# Patient Record
Sex: Male | Born: 1960 | Race: White | Hispanic: No | Marital: Married | Smoking: Current every day smoker
Health system: Southern US, Community
[De-identification: ages and names within clinical notes are randomized; demographics above are authoritative.]

## PROBLEM LIST (undated history)

## (undated) DIAGNOSIS — F1020 Alcohol dependence, uncomplicated: Secondary | ICD-10-CM

## (undated) DIAGNOSIS — J449 Chronic obstructive pulmonary disease, unspecified: Secondary | ICD-10-CM

## (undated) DIAGNOSIS — G8929 Other chronic pain: Secondary | ICD-10-CM

## (undated) DIAGNOSIS — K635 Polyp of colon: Secondary | ICD-10-CM

## (undated) DIAGNOSIS — M545 Low back pain: Secondary | ICD-10-CM

## (undated) DIAGNOSIS — B019 Varicella without complication: Secondary | ICD-10-CM

## (undated) HISTORY — DX: Alcohol dependence, uncomplicated: F10.20

## (undated) HISTORY — DX: Varicella without complication: B01.9

## (undated) HISTORY — PX: SHOULDER SURGERY: SHX246

## (undated) HISTORY — DX: Low back pain: M54.5

## (undated) HISTORY — DX: Other chronic pain: G89.29

## (undated) HISTORY — PX: SPINAL CORD STIMULATOR INSERTION: SHX5378

## (undated) HISTORY — PX: ROTATOR CUFF REPAIR: SHX139

## (undated) HISTORY — DX: Polyp of colon: K63.5

---
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
## 1999-02-11 HISTORY — PX: OTHER SURGICAL HISTORY: SHX169

## 2006-12-09 ENCOUNTER — Emergency Department (HOSPITAL_COMMUNITY): Admission: EM | Admit: 2006-12-09 | Discharge: 2006-12-09 | Payer: Self-pay | Admitting: Emergency Medicine

## 2007-07-03 ENCOUNTER — Emergency Department (HOSPITAL_COMMUNITY): Admission: EM | Admit: 2007-07-03 | Discharge: 2007-07-03 | Payer: Self-pay | Admitting: Emergency Medicine

## 2008-10-22 ENCOUNTER — Emergency Department (HOSPITAL_COMMUNITY): Admission: EM | Admit: 2008-10-22 | Discharge: 2008-10-22 | Payer: Self-pay | Admitting: Emergency Medicine

## 2008-12-08 ENCOUNTER — Encounter: Admission: RE | Admit: 2008-12-08 | Discharge: 2008-12-08 | Payer: Self-pay | Admitting: Orthopedic Surgery

## 2009-03-08 ENCOUNTER — Emergency Department (HOSPITAL_COMMUNITY): Admission: EM | Admit: 2009-03-08 | Discharge: 2009-03-08 | Payer: Self-pay | Admitting: Emergency Medicine

## 2009-10-13 ENCOUNTER — Emergency Department (HOSPITAL_COMMUNITY): Admission: EM | Admit: 2009-10-13 | Discharge: 2009-10-14 | Payer: Self-pay | Admitting: Emergency Medicine

## 2010-05-17 LAB — URINALYSIS, ROUTINE W REFLEX MICROSCOPIC
Bilirubin Urine: NEGATIVE
Glucose, UA: NEGATIVE mg/dL
Hgb urine dipstick: NEGATIVE
Ketones, ur: NEGATIVE mg/dL
Nitrite: NEGATIVE
Protein, ur: NEGATIVE mg/dL
Specific Gravity, Urine: 1.009 (ref 1.005–1.030)
Urobilinogen, UA: 0.2 mg/dL (ref 0.0–1.0)
pH: 6 (ref 5.0–8.0)

## 2010-05-17 LAB — COMPREHENSIVE METABOLIC PANEL
ALT: 26 U/L (ref 0–53)
AST: 23 U/L (ref 0–37)
Albumin: 3.8 g/dL (ref 3.5–5.2)
Alkaline Phosphatase: 71 U/L (ref 39–117)
BUN: 7 mg/dL (ref 6–23)
CO2: 26 mEq/L (ref 19–32)
Calcium: 8.9 mg/dL (ref 8.4–10.5)
Chloride: 108 mEq/L (ref 96–112)
Creatinine, Ser: 0.9 mg/dL (ref 0.4–1.5)
GFR calc Af Amer: >60 mL/min (ref >60)
GFR calc non Af Amer: >60 mL/min (ref >60)
Glucose, Bld: 97 mg/dL (ref 70–99)
Potassium: 4 mEq/L (ref 3.5–5.1)
Sodium: 141 mEq/L (ref 135–145)
Total Bilirubin: 0.6 mg/dL (ref 0.3–1.2)
Total Protein: 6.2 g/dL (ref 6.0–8.3)

## 2010-05-17 LAB — DIFFERENTIAL
Basophils Absolute: 0.1 10*3/uL (ref 0.0–0.1)
Basophils Relative: 1 % (ref 0–1)
Eosinophils Absolute: 0.1 10*3/uL (ref 0.0–0.7)
Eosinophils Relative: 1 % (ref 0–5)
Lymphocytes Relative: 17 % (ref 12–46)
Lymphs Abs: 1.5 10*3/uL (ref 0.7–4.0)
Monocytes Absolute: 0.5 10*3/uL (ref 0.1–1.0)
Monocytes Relative: 5 % (ref 3–12)
Neutro Abs: 6.9 10*3/uL (ref 1.7–7.7)
Neutrophils Relative %: 76 % (ref 43–77)

## 2010-05-17 LAB — CBC
HCT: 44.4 % (ref 39.0–52.0)
Hemoglobin: 15.2 g/dL (ref 13.0–17.0)
MCHC: 34.2 g/dL (ref 30.0–36.0)
MCV: 92 fL (ref 78.0–100.0)
Platelets: 230 10*3/uL (ref 150–400)
RBC: 4.83 MIL/uL (ref 4.22–5.81)
RDW: 14 % (ref 11.5–15.5)
WBC: 9.1 10*3/uL (ref 4.0–10.5)

## 2010-05-17 LAB — LIPASE, BLOOD: Lipase: 25 U/L (ref 11–59)

## 2010-05-17 LAB — ETHANOL: Alcohol, Ethyl (B): 110 mg/dL — ABNORMAL HIGH (ref 0–10)

## 2010-11-20 LAB — DIFFERENTIAL
Basophils Absolute: 0
Basophils Relative: 0
Eosinophils Absolute: 0.1
Eosinophils Relative: 1
Lymphocytes Relative: 26
Lymphs Abs: 2
Monocytes Absolute: 0.6
Monocytes Relative: 8
Neutro Abs: 4.9
Neutrophils Relative %: 65

## 2010-11-20 LAB — POCT CARDIAC MARKERS
CKMB, poc: 6.2
CKMB, poc: 6.5
Myoglobin, poc: 106
Myoglobin, poc: 95.7
Operator id: 234501
Operator id: 235261
Troponin i, poc: <0.05
Troponin i, poc: <0.05

## 2010-11-20 LAB — I-STAT 8, (EC8 V) (CONVERTED LAB)
Acid-Base Excess: 1
BUN: 15
Bicarbonate: 25.4 — ABNORMAL HIGH
Chloride: 106
Glucose, Bld: 93
HCT: 46
Hemoglobin: 15.6
Operator id: 235261
Potassium: 4
Sodium: 139
TCO2: 27
pCO2, Ven: 39.1 — ABNORMAL LOW
pH, Ven: 7.42 — ABNORMAL HIGH

## 2010-11-20 LAB — POCT I-STAT CREATININE
Creatinine, Ser: 0.9
Operator id: 235261

## 2010-11-20 LAB — CBC
HCT: 43.6
Hemoglobin: 14.9
MCHC: 34
MCV: 89.1
Platelets: 269
RBC: 4.9
RDW: 13.6
WBC: 7.5

## 2010-11-20 LAB — D-DIMER, QUANTITATIVE: D-Dimer, Quant: 0.3

## 2012-09-05 ENCOUNTER — Encounter (HOSPITAL_COMMUNITY): Payer: Self-pay | Admitting: *Deleted

## 2012-09-05 ENCOUNTER — Emergency Department (HOSPITAL_COMMUNITY): Payer: 59

## 2012-09-05 ENCOUNTER — Emergency Department (HOSPITAL_COMMUNITY)
Admission: EM | Admit: 2012-09-05 | Discharge: 2012-09-05 | Disposition: A | Discharge status: Home / Self Care | Payer: 59 | Attending: Emergency Medicine | Admitting: Emergency Medicine

## 2012-09-05 DIAGNOSIS — J4489 Other specified chronic obstructive pulmonary disease: Secondary | ICD-10-CM | POA: Insufficient documentation

## 2012-09-05 DIAGNOSIS — R109 Unspecified abdominal pain: Secondary | ICD-10-CM

## 2012-09-05 DIAGNOSIS — J449 Chronic obstructive pulmonary disease, unspecified: Secondary | ICD-10-CM | POA: Insufficient documentation

## 2012-09-05 DIAGNOSIS — R1032 Left lower quadrant pain: Secondary | ICD-10-CM | POA: Insufficient documentation

## 2012-09-05 HISTORY — DX: Chronic obstructive pulmonary disease, unspecified: J44.9

## 2012-09-05 LAB — CBC WITH DIFFERENTIAL/PLATELET
Basophils Absolute: 0 10*3/uL (ref 0.0–0.1)
Basophils Relative: 0 % (ref 0–1)
Eosinophils Absolute: 0.1 10*3/uL (ref 0.0–0.7)
Eosinophils Relative: 2 % (ref 0–5)
HCT: 43.2 % (ref 39.0–52.0)
Hemoglobin: 14.8 g/dL (ref 13.0–17.0)
Lymphocytes Relative: 29 % (ref 12–46)
Lymphs Abs: 2.2 10*3/uL (ref 0.7–4.0)
MCH: 30.5 pg (ref 26.0–34.0)
MCHC: 34.3 g/dL (ref 30.0–36.0)
MCV: 88.9 fL (ref 78.0–100.0)
Monocytes Absolute: 0.5 10*3/uL (ref 0.1–1.0)
Monocytes Relative: 7 % (ref 3–12)
Neutro Abs: 4.8 10*3/uL (ref 1.7–7.7)
Neutrophils Relative %: 63 % (ref 43–77)
Platelets: 259 10*3/uL (ref 150–400)
RBC: 4.86 MIL/uL (ref 4.22–5.81)
RDW: 13.4 % (ref 11.5–15.5)
WBC: 7.7 10*3/uL (ref 4.0–10.5)

## 2012-09-05 LAB — BASIC METABOLIC PANEL
BUN: 12 mg/dL (ref 6–23)
CO2: 27 mEq/L (ref 19–32)
Calcium: 9 mg/dL (ref 8.4–10.5)
Chloride: 106 mEq/L (ref 96–112)
Creatinine, Ser: 0.91 mg/dL (ref 0.50–1.35)
GFR calc Af Amer: >90 mL/min (ref >90)
GFR calc non Af Amer: >90 mL/min (ref >90)
Glucose, Bld: 105 mg/dL — ABNORMAL HIGH (ref 70–99)
Potassium: 3.8 mEq/L (ref 3.5–5.1)
Sodium: 140 mEq/L (ref 135–145)

## 2012-09-05 LAB — URINALYSIS, ROUTINE W REFLEX MICROSCOPIC
Bilirubin Urine: NEGATIVE
Glucose, UA: NEGATIVE mg/dL
Hgb urine dipstick: NEGATIVE
Ketones, ur: NEGATIVE mg/dL
Leukocytes, UA: NEGATIVE
Nitrite: NEGATIVE
Protein, ur: NEGATIVE mg/dL
Specific Gravity, Urine: >1.03 — ABNORMAL HIGH (ref 1.005–1.030)
Urobilinogen, UA: 1 mg/dL (ref 0.0–1.0)
pH: 6.5 (ref 5.0–8.0)

## 2012-09-05 MED ORDER — KETOROLAC TROMETHAMINE 30 MG/ML IJ SOLN
30.0000 mg | Freq: Once | INTRAMUSCULAR | Status: AC
  Administered 2012-09-05: 30 mg via INTRAVENOUS
  Filled 2012-09-05: qty 1

## 2012-09-05 MED ORDER — SODIUM CHLORIDE 0.9 % IV BOLUS (SEPSIS)
500.0000 mL | Freq: Once | INTRAVENOUS | Status: AC
  Administered 2012-09-05: 500 mL via INTRAVENOUS

## 2012-09-05 MED ORDER — TRAMADOL HCL 50 MG PO TABS: 50.0000 mg | ORAL_TABLET | Freq: Four times a day (QID) | ORAL | Status: DC | PRN

## 2012-09-05 NOTE — ED Notes (Signed)
MD at bedside. 

## 2012-09-05 NOTE — ED Notes (Signed)
Pt states he has a knot in the left side of his abdomen with pain that radiates down to his groin.

## 2012-09-05 NOTE — ED Provider Notes (Signed)
CSN: [id number]     Arrival date & time 09/05/12  2038 History     First MD Initiated Contact with Patient 09/05/12 2058     Chief Complaint  Patient presents with  . Abdominal Pain   (Consider location/radiation/quality/duration/timing/severity/associated sxs/prior Treatment) HPI Comments: 52 yo male with past etoh, non smoker presents with LLQ abd pain since Friday.  Mild radiation to left groin.  Intermittent sharp/ ache, at times worse with movement.  No abd surgery hx.  Pt has known small umbilical hernia.  No fevers, vomiting or diarrhea.  No kidney stone hx.  No testicle pain.    Patient is a 52 y.o. male presenting with abdominal pain. The history is provided by the patient.  Abdominal Pain Associated symptoms include abdominal pain. Pertinent negatives include no chest pain, no headaches and no shortness of breath.    Past Medical History  Diagnosis Date  . COPD (chronic obstructive pulmonary disease)    Past Surgical History  Procedure Laterality Date  . Shoulder surgery     History reviewed. No pertinent family history. History  Substance Use Topics  . Smoking status: Never Smoker   . Smokeless tobacco: Not on file  . Alcohol Use: No    Review of Systems  Constitutional: Negative for fever and chills.  HENT: Negative for neck pain and neck stiffness.   Eyes: Negative for visual disturbance.  Respiratory: Negative for shortness of breath.   Cardiovascular: Negative for chest pain.  Gastrointestinal: Positive for abdominal pain. Negative for nausea, vomiting, diarrhea and blood in stool.  Genitourinary: Positive for flank pain. Negative for dysuria.  Musculoskeletal: Negative for back pain.  Skin: Negative for rash.  Neurological: Negative for light-headedness and headaches.    Allergies  Bee venom  Home Medications  No current outpatient prescriptions on file. BP 131/87  Pulse 70  Temp(Src) 98.2 F (36.8 C)  Resp 20  Ht 5\' 7"  (1.702 m)  Wt 220 lb  (99.791 kg)  BMI 34.45 kg/m2  SpO2 98% Physical Exam  Nursing note and vitals reviewed. Constitutional: He is oriented to person, place, and time. He appears well-developed and well-nourished.  HENT:  Head: Normocephalic and atraumatic.  Eyes: Conjunctivae are normal. Right eye exhibits no discharge. Left eye exhibits no discharge.  Neck: Normal range of motion. Neck supple. No tracheal deviation present.  Cardiovascular: Normal rate and regular rhythm.   Pulmonary/Chest: Effort normal and breath sounds normal.  Abdominal: Soft. He exhibits no distension. There is tenderness (left lower quadrant, no hernia palpated in left groin). There is no guarding.  Musculoskeletal: He exhibits no edema.  Neurological: He is alert and oriented to person, place, and time.  Skin: Skin is warm. No rash noted.  Psychiatric: He has a normal mood and affect.    ED Course   Procedures (including critical care time)  Labs Reviewed  BASIC METABOLIC PANEL - Abnormal; Notable for the following:    Glucose, Bld 105 (*)    All other components within normal limits  CBC WITH DIFFERENTIAL  URINALYSIS, ROUTINE W REFLEX MICROSCOPIC   No results found. No diagnosis found.  MDM  Well appearing.  Concern for kidney stone vs diverticular vs MSK.  Pain meds given . Fluids and CT scan.   Ct Abdomen Pelvis Wo Contrast  09/05/2012   *RADIOLOGY REPORT*  Clinical Data: Left lower abdominal pain radiating into groin.  CT ABDOMEN AND PELVIS WITHOUT CONTRAST  Technique:  Multidetector CT imaging of the abdomen and pelvis was  performed following the standard protocol without intravenous contrast.  Comparison: None.  Findings: No evidence of renal obstruction or calculi.  The ureters and bladder are unremarkable.  Unenhanced appearance of the liver, gallbladder, pancreas, spleen, adrenal glands and bowel are unremarkable.  No evidence of bowel obstruction or inflammatory process.  No free fluid or abnormal fluid collection  is identified.  Small nonspecific mesenteric lymph nodes identified.  No overtly enlarged lymph nodes seen.  The appendix is visualized and normal.  Degenerative is present in the lower lumbar spine.  IMPRESSION: No acute findings.   Original Report Authenticated By: Irish Lack, M.D.   Close fup outpt.  No cause for abd pain at this time.  Pain improved in ED.   Enid Skeens, MD 09/05/12 2221

## 2013-01-26 ENCOUNTER — Encounter: Payer: Self-pay | Admitting: Family Medicine

## 2013-01-26 ENCOUNTER — Ambulatory Visit (INDEPENDENT_AMBULATORY_CARE_PROVIDER_SITE_OTHER): Payer: PRIVATE HEALTH INSURANCE | Admitting: Family Medicine

## 2013-01-26 ENCOUNTER — Encounter (INDEPENDENT_AMBULATORY_CARE_PROVIDER_SITE_OTHER): Payer: Self-pay

## 2013-01-26 VITALS — BP 121/79 | HR 65 | Temp 97.6°F | Ht 67.0 in | Wt 205.0 lb

## 2013-01-26 DIAGNOSIS — Z0000 Encounter for general adult medical examination without abnormal findings: Secondary | ICD-10-CM

## 2013-01-26 LAB — POCT CBC
Granulocyte percent: 75.5 %G (ref 37–80)
HCT, POC: 46.8 % (ref 43.5–53.7)
Hemoglobin: 15.9 g/dL (ref 14.1–18.1)
Lymph, poc: 1.6 (ref 0.6–3.4)
MCH, POC: 30.8 pg (ref 27–31.2)
MCHC: 33.9 g/dL (ref 31.8–35.4)
MCV: 90.9 fL (ref 80–97)
MPV: 7.6 fL (ref 0–99.8)
POC Granulocyte: 6 (ref 2–6.9)
POC LYMPH PERCENT: 20.4 %L (ref 10–50)
Platelet Count, POC: 252 10*3/uL (ref 142–424)
RBC: 5.2 M/uL (ref 4.69–6.13)
RDW, POC: 13.5 %
WBC: 7.9 10*3/uL (ref 4.6–10.2)

## 2013-01-26 NOTE — Progress Notes (Signed)
   Subjective:    Patient ID: Matthew Sims, male    DOB: Aug 01, 1960, 52 y.o.   MRN: [id number]  HPI  This 52 y.o. male presents for evaluation of CPE.  He has no acute medical problems.  Review of Systems No chest pain, SOB, HA, dizziness, vision change, N/V, diarrhea, constipation, dysuria, urinary urgency or frequency, myalgias, arthralgias or rash.     Objective:   Physical Exam  Vital signs noted  Well developed well nourished male.  HEENT - Head atraumatic Normocephalic                Eyes - PERRLA, Conjuctiva - clear Sclera- Clear EOMI                Ears - EAC's Wnl TM's Wnl Gross Hearing WNL                Nose - Nares patent                 Throat - oropharanx wnl with poor dentition. Respiratory - Lungs CTA bilateral Cardiac - RRR S1 and S2 without murmur GI - Abdomen soft Nontender and bowel sounds active x 4 Extremities - No edema. Neuro - Grossly intact.      Assessment & Plan:  Routine general medical examination at a health care facility - Plan: POCT CBC, CMP14+EGFR, Lipid panel, Thyroid Panel With TSH, PSA, total and free Declines colonoscopy, flu shot, and recommend he see dentist.  Deatra Canter FNP

## 2013-01-27 LAB — CMP14+EGFR
ALT: 21 IU/L (ref 0–44)
AST: 22 IU/L (ref 0–40)
Albumin/Globulin Ratio: 1.8 (ref 1.1–2.5)
Albumin: 4.1 g/dL (ref 3.5–5.5)
Alkaline Phosphatase: 72 IU/L (ref 39–117)
BUN/Creatinine Ratio: 15 (ref 9–20)
BUN: 13 mg/dL (ref 6–24)
CO2: 24 mmol/L (ref 18–29)
Calcium: 9.6 mg/dL (ref 8.7–10.2)
Chloride: 103 mmol/L (ref 97–108)
Creatinine, Ser: 0.85 mg/dL (ref 0.76–1.27)
GFR calc Af Amer: 114 mL/min/{1.73_m2} (ref >59)
GFR calc non Af Amer: 99 mL/min/{1.73_m2} (ref >59)
Globulin, Total: 2.3 g/dL (ref 1.5–4.5)
Glucose: 88 mg/dL (ref 65–99)
Potassium: 4.2 mmol/L (ref 3.5–5.2)
Sodium: 141 mmol/L (ref 134–144)
Total Bilirubin: 0.6 mg/dL (ref 0.0–1.2)
Total Protein: 6.4 g/dL (ref 6.0–8.5)

## 2013-01-27 LAB — PSA, TOTAL AND FREE
PSA, Free Pct: 20 %
PSA, Free: 0.2 ng/mL
PSA: 1 ng/mL (ref 0.0–4.0)

## 2013-01-27 LAB — THYROID PANEL WITH TSH
Free Thyroxine Index: 2 (ref 1.2–4.9)
T3 Uptake Ratio: 26 % (ref 24–39)
T4, Total: 7.6 ug/dL (ref 4.5–12.0)
TSH: 0.793 u[IU]/mL (ref 0.450–4.500)

## 2013-01-27 LAB — LIPID PANEL
Chol/HDL Ratio: 3.1 ratio units (ref 0.0–5.0)
Cholesterol, Total: 151 mg/dL (ref 100–199)
HDL: 48 mg/dL (ref >39)
LDL Calculated: 79 mg/dL (ref 0–99)
Triglycerides: 121 mg/dL (ref 0–149)
VLDL Cholesterol Cal: 24 mg/dL (ref 5–40)

## 2013-02-01 ENCOUNTER — Telehealth: Payer: Self-pay | Admitting: *Deleted

## 2013-02-01 NOTE — Telephone Encounter (Signed)
Patient aware of results.

## 2013-02-01 NOTE — Telephone Encounter (Signed)
Message copied by Tamera Punt on Tue Feb 01, 2013 11:29 AM ------      Message from: Bennie Pierini      Created: Thu Jan 27, 2013  5:33 PM       All labs are normal- continue all meds and recheck in 3 months ------

## 2014-02-10 HISTORY — PX: HERNIA REPAIR: SHX51

## 2014-07-10 ENCOUNTER — Encounter (HOSPITAL_COMMUNITY): Payer: Self-pay | Admitting: Emergency Medicine

## 2014-07-10 ENCOUNTER — Emergency Department (HOSPITAL_COMMUNITY): Payer: BLUE CROSS/BLUE SHIELD

## 2014-07-10 ENCOUNTER — Emergency Department (HOSPITAL_COMMUNITY)
Admission: EM | Admit: 2014-07-10 | Discharge: 2014-07-10 | Disposition: A | Discharge status: Home / Self Care | Payer: BLUE CROSS/BLUE SHIELD | Attending: Emergency Medicine | Admitting: Emergency Medicine

## 2014-07-10 DIAGNOSIS — Y9389 Activity, other specified: Secondary | ICD-10-CM | POA: Diagnosis not present

## 2014-07-10 DIAGNOSIS — X58XXXA Exposure to other specified factors, initial encounter: Secondary | ICD-10-CM | POA: Insufficient documentation

## 2014-07-10 DIAGNOSIS — Z720 Tobacco use: Secondary | ICD-10-CM | POA: Insufficient documentation

## 2014-07-10 DIAGNOSIS — T7840XA Allergy, unspecified, initial encounter: Secondary | ICD-10-CM | POA: Insufficient documentation

## 2014-07-10 DIAGNOSIS — Y998 Other external cause status: Secondary | ICD-10-CM | POA: Insufficient documentation

## 2014-07-10 DIAGNOSIS — J449 Chronic obstructive pulmonary disease, unspecified: Secondary | ICD-10-CM | POA: Diagnosis not present

## 2014-07-10 DIAGNOSIS — Y9289 Other specified places as the place of occurrence of the external cause: Secondary | ICD-10-CM | POA: Diagnosis not present

## 2014-07-10 DIAGNOSIS — R079 Chest pain, unspecified: Secondary | ICD-10-CM | POA: Insufficient documentation

## 2014-07-10 LAB — BASIC METABOLIC PANEL
Anion gap: 5 (ref 5–15)
BUN: 11 mg/dL (ref 6–20)
CO2: 22 mmol/L (ref 22–32)
Calcium: 9.1 mg/dL (ref 8.9–10.3)
Chloride: 111 mmol/L (ref 101–111)
Creatinine, Ser: 0.94 mg/dL (ref 0.61–1.24)
GFR calc Af Amer: >60 mL/min (ref >60)
GFR calc non Af Amer: >60 mL/min (ref >60)
Glucose, Bld: 100 mg/dL — ABNORMAL HIGH (ref 65–99)
Potassium: 4 mmol/L (ref 3.5–5.1)
Sodium: 138 mmol/L (ref 135–145)

## 2014-07-10 LAB — CBC WITH DIFFERENTIAL/PLATELET
Basophils Absolute: 0 10*3/uL (ref 0.0–0.1)
Basophils Relative: 0 % (ref 0–1)
Eosinophils Absolute: 0 10*3/uL (ref 0.0–0.7)
Eosinophils Relative: 0 % (ref 0–5)
HCT: 45.6 % (ref 39.0–52.0)
Hemoglobin: 15.6 g/dL (ref 13.0–17.0)
Lymphocytes Relative: 7 % — ABNORMAL LOW (ref 12–46)
Lymphs Abs: 1.1 10*3/uL (ref 0.7–4.0)
MCH: 30.4 pg (ref 26.0–34.0)
MCHC: 34.2 g/dL (ref 30.0–36.0)
MCV: 88.7 fL (ref 78.0–100.0)
Monocytes Absolute: 0.5 10*3/uL (ref 0.1–1.0)
Monocytes Relative: 3 % (ref 3–12)
Neutro Abs: 13.4 10*3/uL — ABNORMAL HIGH (ref 1.7–7.7)
Neutrophils Relative %: 90 % — ABNORMAL HIGH (ref 43–77)
Platelets: 253 10*3/uL (ref 150–400)
RBC: 5.14 MIL/uL (ref 4.22–5.81)
RDW: 13.6 % (ref 11.5–15.5)
WBC: 15 10*3/uL — ABNORMAL HIGH (ref 4.0–10.5)

## 2014-07-10 LAB — TROPONIN I
Troponin I: <0.03 ng/mL (ref <0.031)
Troponin I: <0.03 ng/mL (ref <0.031)

## 2014-07-10 MED ORDER — PREDNISONE 20 MG PO TABS: 40.0000 mg | ORAL_TABLET | Freq: Every day | ORAL | Status: DC

## 2014-07-10 MED ORDER — SODIUM CHLORIDE 0.9 % IV SOLN
INTRAVENOUS | Status: DC
Start: 2014-07-10 — End: 2014-07-11
  Administered 2014-07-10: 17:00:00 via INTRAVENOUS

## 2014-07-10 NOTE — ED Notes (Signed)
Pt was stung by a unknown insect pt believes it was a bee. Pt has redness and swelling to left elbow and both lower legs on calfs. Pt had episodes of on and off chest pain while at [facility] urgent care. 325mg  ASA given, 50Mg  benadryl and 125mg  soul medrol. Pt denies chest pain or SOB at this time.

## 2014-07-10 NOTE — Discharge Instructions (Signed)
Allergies °Allergies may happen from anything your body is sensitive to. This may be food, medicines, pollens, chemicals, and nearly anything around you in everyday life that produces allergens. An allergen is anything that causes an allergy producing substance. Heredity is often a factor in causing these problems. This means you may have some of the same allergies as your parents. °Food allergies happen in all age groups. Food allergies are some of the most severe and life threatening. Some common food allergies are cow's milk, seafood, eggs, nuts, wheat, and soybeans. °SYMPTOMS  °· Swelling around the mouth. °· An itchy red rash or hives. °· Vomiting or diarrhea. °· Difficulty breathing. °SEVERE ALLERGIC REACTIONS ARE LIFE-THREATENING. °This reaction is called anaphylaxis. It can cause the mouth and throat to swell and cause difficulty with breathing and swallowing. In severe reactions only a trace amount of food (for example, peanut oil in a salad) may cause death within seconds. °Seasonal allergies occur in all age groups. These are seasonal because they usually occur during the same season every year. They may be a reaction to molds, grass pollens, or tree pollens. Other causes of problems are house dust mite allergens, pet dander, and mold spores. The symptoms often consist of nasal congestion, a runny itchy nose associated with sneezing, and tearing itchy eyes. There is often an associated itching of the mouth and ears. The problems happen when you come in contact with pollens and other allergens. Allergens are the particles in the air that the body reacts to with an allergic reaction. This causes you to release allergic antibodies. Through a chain of events, these eventually cause you to release histamine into the blood stream. Although it is meant to be protective to the body, it is this release that causes your discomfort. This is why you were given anti-histamines to feel better.  If you are unable to  pinpoint the offending allergen, it may be determined by skin or blood testing. Allergies cannot be cured but can be controlled with medicine. °Hay fever is a collection of all or some of the seasonal allergy problems. It may often be treated with simple over-the-counter medicine such as diphenhydramine. Take medicine as directed. Do not drink alcohol or drive while taking this medicine. Check with your caregiver or package insert for child dosages. °If these medicines are not effective, there are many new medicines your caregiver can prescribe. Stronger medicine such as nasal spray, eye drops, and corticosteroids may be used if the first things you try do not work well. Other treatments such as immunotherapy or desensitizing injections can be used if all else fails. Follow up with your caregiver if problems continue. These seasonal allergies are usually not life threatening. They are generally more of a nuisance that can often be handled using medicine. °HOME CARE INSTRUCTIONS  °· If unsure what causes a reaction, keep a diary of foods eaten and symptoms that follow. Avoid foods that cause reactions. °· If hives or rash are present: °· Take medicine as directed. °· You may use an over-the-counter antihistamine (diphenhydramine) for hives and itching as needed. °· Apply cold compresses (cloths) to the skin or take baths in cool water. Avoid hot baths or showers. Heat will make a rash and itching worse. °· If you are severely allergic: °· Following a treatment for a severe reaction, hospitalization is often required for closer follow-up. °· Wear a medic-alert bracelet or necklace stating the allergy. °· You and your family must learn how to give adrenaline or use   an anaphylaxis kit.  If you have had a severe reaction, always carry your anaphylaxis kit or EpiPen with you. Use this medicine as directed by your caregiver if a severe reaction is occurring. Failure to do so could have a fatal outcome. SEEK MEDICAL  CARE IF:  You suspect a food allergy. Symptoms generally happen within 30 minutes of eating a food.  Your symptoms have not gone away within 2 days or are getting worse.  You develop new symptoms.  You want to retest yourself or your child with a food or drink you think causes an allergic reaction. Never do this if an anaphylactic reaction to that food or drink has happened before. Only do this under the care of a caregiver. SEEK IMMEDIATE MEDICAL CARE IF:   You have difficulty breathing, are wheezing, or have a tight feeling in your chest or throat.  You have a swollen mouth, or you have hives, swelling, or itching all over your body.  You have had a severe reaction that has responded to your anaphylaxis kit or an EpiPen. These reactions may return when the medicine has worn off. These reactions should be considered life threatening. MAKE SURE YOU:   Understand these instructions.  Will watch your condition.  Will get help right away if you are not doing well or get worse. Document Released: 04/22/2002 Document Revised: 05/24/2012 Document Reviewed: 09/27/2007 [facility] Patient Information 2015 [place], [place]. This information is not intended to replace advice given to you by your health care provider. Make sure you discuss any questions you have with your health care provider.  Chest Pain (Nonspecific) It is often hard to give a specific diagnosis for the cause of chest pain. There is always a chance that your pain could be related to something serious, such as a heart attack or a blood clot in the lungs. You need to follow up with your health care provider for further evaluation. CAUSES   Heartburn.  Pneumonia or bronchitis.  Anxiety or stress.  Inflammation around your heart (pericarditis) or lung (pleuritis or pleurisy).  A blood clot in the lung.  A collapsed lung (pneumothorax). It can develop suddenly on its own (spontaneous pneumothorax) or from trauma to the  chest.  Shingles infection (herpes zoster virus). The chest wall is composed of bones, muscles, and cartilage. Any of these can be the source of the pain.  The bones can be bruised by injury.  The muscles or cartilage can be strained by coughing or overwork.  The cartilage can be affected by inflammation and become sore (costochondritis). DIAGNOSIS  Lab tests or other studies may be needed to find the cause of your pain. Your health care provider may have you take a test called an ambulatory electrocardiogram (ECG). An ECG records your heartbeat patterns over a 24-hour period. You may also have other tests, such as:  Transthoracic echocardiogram (TTE). During echocardiography, sound waves are used to evaluate how blood flows through your heart.  Transesophageal echocardiogram (TEE).  Cardiac monitoring. This allows your health care provider to monitor your heart rate and rhythm in real time.  Holter monitor. This is a portable device that records your heartbeat and can help diagnose heart arrhythmias. It allows your health care provider to track your heart activity for several days, if needed.  Stress tests by exercise or by giving medicine that makes the heart beat faster. TREATMENT   Treatment depends on what may be causing your chest pain. Treatment may include:  Acid blockers for  heartburn.  Anti-inflammatory medicine.  Pain medicine for inflammatory conditions.  Antibiotics if an infection is present.  You may be advised to change lifestyle habits. This includes stopping smoking and avoiding alcohol, caffeine, and chocolate.  You may be advised to keep your head raised (elevated) when sleeping. This reduces the chance of acid going backward from your stomach into your esophagus. Most of the time, nonspecific chest pain will improve within 2-3 days with rest and mild pain medicine.  HOME CARE INSTRUCTIONS   If antibiotics were prescribed, take them as directed. Finish them  even if you start to feel better.  For the next few days, avoid physical activities that bring on chest pain. Continue physical activities as directed.  Do not use any tobacco products, including cigarettes, chewing tobacco, or electronic cigarettes.  Avoid drinking alcohol.  Only take medicine as directed by your health care provider.  Follow your health care provider's suggestions for further testing if your chest pain does not go away.  Keep any follow-up appointments you made. If you do not go to an appointment, you could develop lasting (chronic) problems with pain. If there is any problem keeping an appointment, call to reschedule. SEEK MEDICAL CARE IF:   Your chest pain does not go away, even after treatment.  You have a rash with blisters on your chest.  You have a fever. SEEK IMMEDIATE MEDICAL CARE IF:   You have increased chest pain or pain that spreads to your arm, neck, jaw, back, or abdomen.  You have shortness of breath.  You have an increasing cough, or you cough up blood.  You have severe back or abdominal pain.  You feel nauseous or vomit.  You have severe weakness.  You faint.  You have chills. This is an emergency. Do not wait to see if the pain will go away. Get medical help at once. Call your local emergency services (911 in [place] Do not drive yourself to the hospital. MAKE SURE YOU:   Understand these instructions.  Will watch your condition.  Will get help right away if you are not doing well or get worse. Document Released: 11/06/2004 Document Revised: 02/01/2013 Document Reviewed: 09/02/2007 [facility] Patient Information 2015 [place], [place]. This information is not intended to replace advice given to you by your health care provider. Make sure you discuss any questions you have with your health care provider.

## 2014-07-10 NOTE — ED Provider Notes (Addendum)
CSN: [id number]     Arrival date & time 07/10/14  1708 History   First MD Initiated Contact with Patient 07/10/14 1716     Chief Complaint  Patient presents with  . Allergic Reaction     (Consider location/radiation/quality/duration/timing/severity/associated sxs/prior Treatment) HPI Comments: Patient here planing of subxiphoid chest discomfort which lasted for approximately one hour. Patient evidenced on by 3 bees and symptoms began slowly after that. He did use Benadryl which is improved his allergic reaction symptoms. He got stung on his right calf, left arm, left leg. Did have some initial pruritus which is improving. Denies any dyspnea. No associated diaphoresis. EMS was called and patient given aspirin but no nitroglycerin and transported here. Does have a history of COPD but denies any prior history of CAD  Patient is a 54 y.o. male presenting with allergic reaction. The history is provided by the patient.  Allergic Reaction   Past Medical History  Diagnosis Date  . COPD (chronic obstructive pulmonary disease)    Past Surgical History  Procedure Laterality Date  . Shoulder surgery     No family history on file. History  Substance Use Topics  . Smoking status: Current Every Day Smoker -- 1.00 packs/day    Types: Cigarettes  . Smokeless tobacco: Not on file  . Alcohol Use: No    Review of Systems  All other systems reviewed and are negative.     Allergies  Bee venom  Home Medications   Prior to Admission medications   Not on File   BP 116/72 mmHg  Pulse 66  Temp(Src) 97.6 F (36.4 C) (Oral)  Resp 18  Ht 5\' 7"  (1.702 m)  Wt 190 lb (86.183 kg)  BMI 29.75 kg/m2  SpO2 99% Physical Exam  Constitutional: He is oriented to person, place, and time. He appears well-developed and well-nourished.  Non-toxic appearance. No distress.  HENT:  Head: Normocephalic and atraumatic.  Eyes: Conjunctivae, EOM and lids are normal. Pupils are equal, round, and reactive to  light.  Neck: Normal range of motion. Neck supple. No tracheal deviation present. No thyroid mass present.  Cardiovascular: Normal rate, regular rhythm and normal heart sounds.  Exam reveals no gallop.   No murmur heard. Pulmonary/Chest: Effort normal and breath sounds normal. No stridor. No respiratory distress. He has no decreased breath sounds. He has no wheezes. He has no rhonchi. He has no rales.  Abdominal: Soft. Normal appearance and bowel sounds are normal. He exhibits no distension. There is no tenderness. There is no rebound and no CVA tenderness.  Musculoskeletal: Normal range of motion. He exhibits no edema or tenderness.  Neurological: He is alert and oriented to person, place, and time. He has normal strength. No cranial nerve deficit or sensory deficit. GCS eye subscore is 4. GCS verbal subscore is 5. GCS motor subscore is 6.  Skin: Skin is warm and dry. No abrasion and no rash noted.  Psychiatric: He has a normal mood and affect. His speech is normal and behavior is normal.  Nursing note and vitals reviewed.   ED Course  Procedures (including critical care time) Labs Review Labs Reviewed  CBC WITH DIFFERENTIAL/PLATELET  BASIC METABOLIC PANEL  TROPONIN I    Imaging Review No results found.   EKG Interpretation   Date/Time:  Monday Jul 10 2014 17:11:55 EDT Ventricular Rate:  72 PR Interval:  146 QRS Duration: 88 QT Interval:  395 QTC Calculation: 432 R Axis:   77 Text Interpretation:  Sinus rhythm No  significant change since last  tracing Confirmed by Ashtyn Meland  MD, Damon Baisch ([id number]) on 07/10/2014 5:18:11 PM      MDM   Final diagnoses:  Chest pain   Patient has a high score of 2 Patient's EKG reviewed from the urgent care center and shows no signs of ischemic changes. Patient had a delta troponin here which was negative. His allergic reaction has improved. Will be discharged home and encouraged to follow-up with his Dr.   Lacretia Leigh, MD 07/10/14  2230  Lacretia Leigh, MD 07/10/14 2232

## 2014-07-10 NOTE — ED Notes (Signed)
Pt presents to department for evaluation of insect bite and chest pain. Pt states he was outside and thinks he was stung multiple times by bee. Redness noted to bilateral lower legs and L arm. Reports he was seen at [facility], also states intermittent midsternal non radiating chest pain. Denies CP at the time. Respirations unlabored. Pt is alert and oriented x4.

## 2014-11-16 ENCOUNTER — Ambulatory Visit (INDEPENDENT_AMBULATORY_CARE_PROVIDER_SITE_OTHER): Payer: BLUE CROSS/BLUE SHIELD | Admitting: Family Medicine

## 2014-11-16 ENCOUNTER — Encounter: Payer: Self-pay | Admitting: Family Medicine

## 2014-11-16 ENCOUNTER — Telehealth: Payer: Self-pay | Admitting: Family Medicine

## 2014-11-16 VITALS — BP 122/80 | HR 63 | Temp 98.2°F | Resp 18 | Ht 65.0 in | Wt 188.0 lb

## 2014-11-16 DIAGNOSIS — F1010 Alcohol abuse, uncomplicated: Secondary | ICD-10-CM

## 2014-11-16 DIAGNOSIS — Z1211 Encounter for screening for malignant neoplasm of colon: Secondary | ICD-10-CM | POA: Insufficient documentation

## 2014-11-16 DIAGNOSIS — Z1159 Encounter for screening for other viral diseases: Secondary | ICD-10-CM | POA: Insufficient documentation

## 2014-11-16 DIAGNOSIS — Z720 Tobacco use: Secondary | ICD-10-CM | POA: Insufficient documentation

## 2014-11-16 DIAGNOSIS — F1011 Alcohol abuse, in remission: Secondary | ICD-10-CM | POA: Insufficient documentation

## 2014-11-16 DIAGNOSIS — E669 Obesity, unspecified: Secondary | ICD-10-CM | POA: Insufficient documentation

## 2014-11-16 DIAGNOSIS — Z114 Encounter for screening for human immunodeficiency virus [HIV]: Secondary | ICD-10-CM | POA: Insufficient documentation

## 2014-11-16 DIAGNOSIS — T7840XA Allergy, unspecified, initial encounter: Secondary | ICD-10-CM | POA: Insufficient documentation

## 2014-11-16 DIAGNOSIS — Z0000 Encounter for general adult medical examination without abnormal findings: Secondary | ICD-10-CM | POA: Insufficient documentation

## 2014-11-16 DIAGNOSIS — Z299 Encounter for prophylactic measures, unspecified: Secondary | ICD-10-CM | POA: Diagnosis not present

## 2014-11-16 HISTORY — DX: Alcohol abuse, in remission: F10.11

## 2014-11-16 LAB — COMPREHENSIVE METABOLIC PANEL
ALT: 17 U/L (ref 0–53)
AST: 17 U/L (ref 0–37)
Albumin: 4 g/dL (ref 3.5–5.2)
Alkaline Phosphatase: 63 U/L (ref 39–117)
BUN: 10 mg/dL (ref 6–23)
CO2: 28 mEq/L (ref 19–32)
Calcium: 9.5 mg/dL (ref 8.4–10.5)
Chloride: 105 mEq/L (ref 96–112)
Creatinine, Ser: 0.86 mg/dL (ref 0.40–1.50)
GFR: 97.72 mL/min (ref >60.00)
Glucose, Bld: 93 mg/dL (ref 70–99)
Potassium: 4.4 mEq/L (ref 3.5–5.1)
Sodium: 140 mEq/L (ref 135–145)
Total Bilirubin: 0.9 mg/dL (ref 0.2–1.2)
Total Protein: 6.6 g/dL (ref 6.0–8.3)

## 2014-11-16 LAB — CBC WITH DIFFERENTIAL/PLATELET
Basophils Absolute: 0 10*3/uL (ref 0.0–0.1)
Basophils Relative: 0.6 % (ref 0.0–3.0)
Eosinophils Absolute: 0.1 10*3/uL (ref 0.0–0.7)
Eosinophils Relative: 1.3 % (ref 0.0–5.0)
HCT: 47.7 % (ref 39.0–52.0)
Hemoglobin: 15.6 g/dL (ref 13.0–17.0)
Lymphocytes Relative: 28.2 % (ref 12.0–46.0)
Lymphs Abs: 1.8 10*3/uL (ref 0.7–4.0)
MCHC: 32.8 g/dL (ref 30.0–36.0)
MCV: 89.7 fl (ref 78.0–100.0)
Monocytes Absolute: 0.6 10*3/uL (ref 0.1–1.0)
Monocytes Relative: 8.7 % (ref 3.0–12.0)
Neutro Abs: 3.9 10*3/uL (ref 1.4–7.7)
Neutrophils Relative %: 61.2 % (ref 43.0–77.0)
Platelets: 262 10*3/uL (ref 150.0–400.0)
RBC: 5.32 Mil/uL (ref 4.22–5.81)
RDW: 13.9 % (ref 11.5–15.5)
WBC: 6.4 10*3/uL (ref 4.0–10.5)

## 2014-11-16 LAB — TSH: TSH: 0.64 u[IU]/mL (ref 0.35–4.50)

## 2014-11-16 LAB — LIPID PANEL
Cholesterol: 142 mg/dL (ref 0–200)
HDL: 55.5 mg/dL (ref >39.00)
LDL Cholesterol: 78 mg/dL (ref 0–99)
NonHDL: 86.69
Total CHOL/HDL Ratio: 3
Triglycerides: 45 mg/dL (ref 0.0–149.0)
VLDL: 9 mg/dL (ref 0.0–40.0)

## 2014-11-16 LAB — HEMOGLOBIN A1C: Hgb A1c MFr Bld: 5.4 % (ref 4.6–6.5)

## 2014-11-16 MED ORDER — EPINEPHRINE 0.3 MG/0.3ML IJ SOAJ: 0.3000 mg | Freq: Once | INTRAMUSCULAR | Status: DC

## 2014-11-16 NOTE — Progress Notes (Signed)
Subjective:    Patient ID: Matthew Sims, male    DOB: [birth year], 54 y.o.   MRN: [id number]  HPI  Patient presents for new patient establishment and discuss concerns over the allergy. All past medical history, surgical history, allergies, family history, immunizations and social history was obtained from the patient today and entered into the electronic medical record. Records are requested from her prior PCP, and will be reviewed at the time they are received. All medical records will be updated at that time.  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 This was first diagnosed when he was a child, after having severe swelling and difficulty breathing. Patient has never been stung until earlier this year. He states the Memorial Day weekend he went to lift up a chair to sit on, when there was a hive underneath. He was stung at least 3 times in his arms and legs. He states he started swelling near the site of the sting immediately, and then his arms became swollen all the way up into his armpits. He reports having difficulty breathing and altered sensation in his chest. He was taken to the emergency room and treated for his allergic reaction. He is wondering if he needs to carry an epinephrine pen with him.  Health maintenance:  Colonoscopy: Patient has never had a colonoscopy, and no family history Immunizations: Flu shot indicated, tetanus up-to-date, next due 2017), pneumonia vaccination is indicated for smoking Infectious disease screening: HIV and hepatitis C indicated Past Medical History  Diagnosis Date  . COPD (chronic obstructive pulmonary disease) ([facility])   . Allergy   . Chicken pox    Allergies  Allergen Reactions  . Bee Venom Swelling    Swelling around the area / hands / lips Difficulty in swallowing   Past Surgical History  Procedure Laterality Date  . Shoulder surgery    . Hernia repair  3570    umbilical hernia  . Tonsillectomy  1966  . Rotator cuff  tear Left 2001  . Rotator cuff repair Right 2000, 2010   Family History  Problem Relation Age of Onset  . Breast cancer Mother   . COPD Mother   . Alcohol abuse Maternal Grandfather   \ Social History   Social History  . Marital Status: Single    Spouse Name: N/A  . Number of Children: N/A  . Years of Education: N/A   Occupational History  . Not on file.   Social History Main Topics  . Smoking status: Current Every Day Smoker -- 1.00 packs/day    Types: Cigarettes  . Smokeless tobacco: Never Used  . Alcohol Use: No     Comment: sober since 2011  . Drug Use: No  . Sexual Activity: Yes   Other Topics Concern  . Not on file   Social History Narrative   Single. Employed. High school education.    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   Smokes daily.    Wears seat belt.    Exercises  3 times a week.    Smoke alarm in the home.    Guns in the home.     Review of Systems Negative, with the exception of above mentioned in HPI     Objective:   Physical Exam BP 122/80 mmHg  Pulse 63  Temp(Src) 98.2 F (36.8 C) (Temporal)  Resp 18  Ht 5\' 5"  (1.651 m)  Wt 188 lb (85.276 kg)  BMI  31.28 kg/m2  SpO2 94% Gen: Afebrile. No acute distress. Nontoxic in appearance, well-developed, well-nourished, mildly obese Caucasian male. HENT: AT. [place]. Bilateral TM visualized and normal in appearance. MMM. Bilateral nares without erythema or swelling. Throat without erythema or exudates.  Eyes:Pupils Equal Round Reactive to light, Extraocular movements intact,  Conjunctiva without redness, discharge or icterus. Neck/lymp/endocrine: Supple, no lymphadenopathy, no thyromegaly CV: RRR no, no edema, +2/4 P posterior tibialis pulses Chest: CTAB, no wheeze or crackles Abd: Soft. Round. NTND. BS present. No Masses palpated.  Skin: No rashes, purpura or petechiae.  Neuro: Normal gait. PERLA. EOMi. Alert. Oriented x3 Psych: Normal affect, dress and demeanor. Normal  speech. Normal thought content and judgment..      Assessment & Plan:  1. Obesity - Counseled on 150 minutes of exercise weekly, healthy diet consisting of low saturated/trans-fat, low salt, high fiber, low fructose plenty of fresh fruits and vegetables. - Lipid panel - HgB A1c - TSH  2. Encounter for preventive measure - CBC w/Diff, TSH, hemoglobin A1c and lipid profile collected today. - Patient declines flu vaccination today. Colonoscopy: Patient has never had a colonoscopy, and no family history. Ordered today.  Immunizations: Flu shot declined, tetanus up-to-date, (next due 2017), pneumonia vaccination is indicated for smoking declined today Infectious disease screening: HIV and hepatitis C collected   3. History of alcohol abuse - Patient has not had a drink in over 4 years. He attends AA meetings regularly. - Comprehensive metabolic panel  4. Encounter for screening for HIV - HIV antibody (with reflex)  5. Need for hepatitis C screening test - Hepatitis C Antibody  6. Tobacco abuse - Patient declined any counseling or smoking cessation today.  7. Colon cancer screening - Patient has never had a screening, would like referral to be placed somewhere near his home near [facility] or [facility]. - Ambulatory referral to Gastroenterology  8. Severe allergic reaction, initial encounter - Discussed use of EpiPen in the event of emergency, with emergent symptoms after bee sting. Patient would like to have an epinephrine drip and on hand after he was sent to the emergency room after his last bee sting. - EPINEPHrine 0.3 mg/0.3 mL IJ SOAJ injection; Inject 0.3 mLs (0.3 mg total) into the muscle once.  Dispense: 1 Device; Refill: 0  AVS on health maintenance provided to patient today. Greater than 45 minutes was spent with patient, greater than 50% of that time was spent face-to-face with patient counseling and coordinating care.

## 2014-11-16 NOTE — Telephone Encounter (Signed)
Patient is checking on Rx for EpiPen. Please send to [facility]. He will decide whether he will fill it after he finds out how much it costs.

## 2014-11-16 NOTE — Patient Instructions (Addendum)
Health Maintenance, Male A healthy lifestyle and preventative care can promote health and wellness.  Maintain regular health, dental, and eye exams.  Eat a healthy diet. Foods like vegetables, fruits, whole grains, low-fat dairy products, and lean protein foods contain the nutrients you need and are low in calories. Decrease your intake of foods high in solid fats, added sugars, and salt. Get information about a proper diet from your health care provider, if necessary.  Regular physical exercise is one of the most important things you can do for your health. Most adults should get at least 150 minutes of moderate-intensity exercise (any activity that increases your heart rate and causes you to sweat) each week. In addition, most adults need muscle-strengthening exercises on 2 or more days a week.   Maintain a healthy weight. The body mass index (BMI) is a screening tool to identify possible weight problems. It provides an estimate of body fat based on height and weight. Your health care provider can find your BMI and can help you achieve or maintain a healthy weight. For males 20 years and older:  A BMI below 18.5 is considered underweight.  A BMI of 18.5 to 24.9 is normal.  A BMI of 25 to 29.9 is considered overweight.  A BMI of 30 and above is considered obese.  Maintain normal blood lipids and cholesterol by exercising and minimizing your intake of saturated fat. Eat a balanced diet with plenty of fruits and vegetables. Blood tests for lipids and cholesterol should begin at age 20 and be repeated every 5 years. If your lipid or cholesterol levels are high, you are over age 50, or you are at high risk for heart disease, you may need your cholesterol levels checked more frequently.Ongoing high lipid and cholesterol levels should be treated with medicines if diet and exercise are not working.  If you smoke, find out from your health care provider how to quit. If you do not use tobacco, do not  start.  Lung cancer screening is recommended for adults aged 55-80 years who are at high risk for developing lung cancer because of a history of smoking. A yearly low-dose CT scan of the lungs is recommended for people who have at least a 30-pack-year history of smoking and are current smokers or have quit within the past 15 years. A pack year of smoking is smoking an average of 1 pack of cigarettes a day for 1 year (for example, a 30-pack-year history of smoking could mean smoking 1 pack a day for 30 years or 2 packs a day for 15 years). Yearly screening should continue until the smoker has stopped smoking for at least 15 years. Yearly screening should be stopped for people who develop a health problem that would prevent them from having lung cancer treatment.  If you choose to drink alcohol, do not have more than 2 drinks per day. One drink is considered to be 12 oz (360 mL) of beer, 5 oz (150 mL) of wine, or 1.5 oz (45 mL) of liquor.  Avoid the use of street drugs. Do not share needles with anyone. Ask for help if you need support or instructions about stopping the use of drugs.  High blood pressure causes heart disease and increases the risk of stroke. High blood pressure is more likely to develop in:  People who have blood pressure in the end of the normal range (100-139/85-89 mm Hg).  People who are overweight or obese.  People who are African American.    If you are 18-39 years of age, have your blood pressure checked every 3-5 years. If you are 40 years of age or older, have your blood pressure checked every year. You should have your blood pressure measured twice--once when you are at a hospital or clinic, and once when you are not at a hospital or clinic. Record the average of the two measurements. To check your blood pressure when you are not at a hospital or clinic, you can use:  An automated blood pressure machine at a pharmacy.  A home blood pressure monitor.  If you are 45-79 years  old, ask your health care provider if you should take aspirin to prevent heart disease.  Diabetes screening involves taking a blood sample to check your fasting blood sugar level. This should be done once every 3 years after age 45 if you are at a normal weight and without risk factors for diabetes. Testing should be considered at a younger age or be carried out more frequently if you are overweight and have at least 1 risk factor for diabetes.  Colorectal cancer can be detected and often prevented. Most routine colorectal cancer screening begins at the age of 50 and continues through age 75. However, your health care provider may recommend screening at an earlier age if you have risk factors for colon cancer. On a yearly basis, your health care provider may provide home test kits to check for hidden blood in the stool. A small camera at the end of a tube may be used to directly examine the colon (sigmoidoscopy or colonoscopy) to detect the earliest forms of colorectal cancer. Talk to your health care provider about this at age 50 when routine screening begins. A direct exam of the colon should be repeated every 5-10 years through age 75, unless early forms of precancerous polyps or small growths are found.  People who are at an increased risk for hepatitis B should be screened for this virus. You are considered at high risk for hepatitis B if:  You were born in a country where hepatitis B occurs often. Talk with your health care provider about which countries are considered high risk.  Your parents were born in a high-risk country and you have not received a shot to protect against hepatitis B (hepatitis B vaccine).  You have HIV or AIDS.  You use needles to inject street drugs.  You live with, or have sex with, someone who has hepatitis B.  You are a man who has sex with other men (MSM).  You get hemodialysis treatment.  You take certain medicines for conditions like cancer, organ  transplantation, and autoimmune conditions.  Hepatitis C blood testing is recommended for all people born from 1945 through 1965 and any individual with known risk factors for hepatitis C.  Healthy men should no longer receive prostate-specific antigen (PSA) blood tests as part of routine cancer screening. Talk to your health care provider about prostate cancer screening.  Testicular cancer screening is not recommended for adolescents or adult males who have no symptoms. Screening includes self-exam, a health care provider exam, and other screening tests. Consult with your health care provider about any symptoms you have or any concerns you have about testicular cancer.  Practice safe sex. Use condoms and avoid high-risk sexual practices to reduce the spread of sexually transmitted infections (STIs).  You should be screened for STIs, including gonorrhea and chlamydia if:  You are sexually active and are younger than 24 years.  You   are older than 24 years, and your health care provider tells you that you are at risk for this type of infection.  Your sexual activity has changed since you were last screened, and you are at an increased risk for chlamydia or gonorrhea. Ask your health care provider if you are at risk.  If you are at risk of being infected with HIV, it is recommended that you take a prescription medicine daily to prevent HIV infection. This is called pre-exposure prophylaxis (PrEP). You are considered at risk if:  You are a man who has sex with other men (MSM).  You are a heterosexual man who is sexually active with multiple partners.  You take drugs by injection.  You are sexually active with a partner who has HIV.  Talk with your health care provider about whether you are at high risk of being infected with HIV. If you choose to begin PrEP, you should first be tested for HIV. You should then be tested every 3 months for as long as you are taking PrEP.  Use sunscreen. Apply  sunscreen liberally and repeatedly throughout the day. You should seek shade when your shadow is shorter than you. Protect yourself by wearing long sleeves, pants, a wide-brimmed hat, and sunglasses year round whenever you are outdoors.  Tell your health care provider of new moles or changes in moles, especially if there is a change in shape or color. Also, tell your health care provider if a mole is larger than the size of a pencil eraser.  A one-time screening for abdominal aortic aneurysm (AAA) and surgical repair of large AAAs by ultrasound is recommended for men aged 42-75 years who are current or former smokers.  Stay current with your vaccines (immunizations).   This information is not intended to replace advice given to you by your health care provider. Make sure you discuss any questions you have with your health care provider.   Document Released: 07/26/2007 Document Revised: 02/17/2014 Document Reviewed: 06/24/2010 Elsevier Interactive Patient Education 2016 [employer].  Smoking Cessation, Tips for Success If you are ready to quit smoking, congratulations! You have chosen to help yourself be healthier. Cigarettes bring nicotine, tar, carbon monoxide, and other irritants into your body. Your lungs, heart, and blood vessels will be able to work better without these poisons. There are many different ways to quit smoking. Nicotine gum, nicotine patches, a nicotine inhaler, or nicotine nasal spray can help with physical craving. Hypnosis, support groups, and medicines help break the habit of smoking. WHAT THINGS CAN I DO TO MAKE QUITTING EASIER?  Here are some tips to help you quit for good:  Pick a date when you will quit smoking completely. Tell all of your friends and family about your plan to quit on that date.  Do not try to slowly cut down on the number of cigarettes you are smoking. Pick a quit date and quit smoking completely starting on that day.  Throw away all cigarettes.    Clean and remove all ashtrays from your home, work, and car.  On a card, write down your reasons for quitting. Carry the card with you and read it when you get the urge to smoke.  Cleanse your body of nicotine. Drink enough water and fluids to keep your urine clear or pale yellow. Do this after quitting to flush the nicotine from your body.  Learn to predict your moods. Do not let a bad situation be your excuse to have a cigarette. Some  situations in your life might tempt you into wanting a cigarette.  Never have "just one" cigarette. It leads to wanting another and another. Remind yourself of your decision to quit.  Change habits associated with smoking. If you smoked while driving or when feeling stressed, try other activities to replace smoking. Stand up when drinking your coffee. Brush your teeth after eating. Sit in a different chair when you read the paper. Avoid alcohol while trying to quit, and try to drink fewer caffeinated beverages. Alcohol and caffeine may urge you to smoke.  Avoid foods and drinks that can trigger a desire to smoke, such as sugary or spicy foods and alcohol.  Ask people who smoke not to smoke around you.  Have something planned to do right after eating or having a cup of coffee. For example, plan to take a walk or exercise.  Try a relaxation exercise to calm you down and decrease your stress. Remember, you may be tense and nervous for the first 2 weeks after you quit, but this will pass.  Find new activities to keep your hands busy. Play with a pen, coin, or rubber band. Doodle or draw things on paper.  Brush your teeth right after eating. This will help cut down on the craving for the taste of tobacco after meals. You can also try mouthwash.   Use oral substitutes in place of cigarettes. Try using lemon drops, carrots, cinnamon sticks, or chewing gum. Keep them handy so they are available when you have the urge to smoke.  When you have the urge to smoke,  try deep breathing.  Designate your home as a nonsmoking area.  If you are a heavy smoker, ask your health care provider about a prescription for nicotine chewing gum. It can ease your withdrawal from nicotine.  Reward yourself. Set aside the cigarette money you save and buy yourself something nice.  Look for support from others. Join a support group or smoking cessation program. Ask someone at home or at work to help you with your plan to quit smoking.  Always ask yourself, "Do I need this cigarette or is this just a reflex?" Tell yourself, "Today, I choose not to smoke," or "I do not want to smoke." You are reminding yourself of your decision to quit.  Do not replace cigarette smoking with electronic cigarettes (commonly called e-cigarettes). The safety of e-cigarettes is unknown, and some may contain harmful chemicals.  If you relapse, do not give up! Plan ahead and think about what you will do the next time you get the urge to smoke. HOW WILL I FEEL WHEN I QUIT SMOKING? You may have symptoms of withdrawal because your body is used to nicotine (the addictive substance in cigarettes). You may crave cigarettes, be irritable, feel very hungry, cough often, get headaches, or have difficulty concentrating. The withdrawal symptoms are only temporary. They are strongest when you first quit but will go away within 10-14 days. When withdrawal symptoms occur, stay in control. Think about your reasons for quitting. Remind yourself that these are signs that your body is healing and getting used to being without cigarettes. Remember that withdrawal symptoms are easier to treat than the major diseases that smoking can cause.  Even after the withdrawal is over, expect periodic urges to smoke. However, these cravings are generally short lived and will go away whether you smoke or not. Do not smoke! WHAT RESOURCES ARE AVAILABLE TO HELP ME QUIT SMOKING? Your health care provider can direct you  to community  resources or hospitals for support, which may include:  Group support.  Education.  Hypnosis.  Therapy.   This information is not intended to replace advice given to you by your health care provider. Make sure you discuss any questions you have with your health care provider.   Document Released: 10/26/2003 Document Revised: 02/17/2014 Document Reviewed: 07/15/2012 Elsevier Interactive Patient Education [employer].   Tdap next year, Colonoscopy schedule !!!!!! They will be calling you to schedule.

## 2014-11-16 NOTE — Progress Notes (Signed)
Pre visit review using our clinic review tool, if applicable. No additional management support is needed unless otherwise documented below in the visit note. 

## 2014-11-16 NOTE — Telephone Encounter (Signed)
Please advise 

## 2014-11-17 LAB — HIV ANTIBODY (ROUTINE TESTING W REFLEX): HIV 1&2 Ab, 4th Generation: NONREACTIVE

## 2014-11-17 LAB — HEPATITIS C ANTIBODY: HCV Ab: NEGATIVE

## 2014-11-21 ENCOUNTER — Telehealth: Payer: Self-pay | Admitting: Family Medicine

## 2014-11-21 ENCOUNTER — Encounter: Payer: Self-pay | Admitting: Gastroenterology

## 2014-11-21 NOTE — Telephone Encounter (Signed)
Left detailed message on pt's cell.  OKay per DPR.  

## 2014-11-21 NOTE — Telephone Encounter (Signed)
Please call patient, all of his lab work was completely normal. Follow-up in one year unless needed sooner.

## 2014-12-25 ENCOUNTER — Ambulatory Visit (AMBULATORY_SURGERY_CENTER): Payer: Self-pay | Admitting: *Deleted

## 2014-12-25 VITALS — Ht 65.0 in | Wt 188.8 lb

## 2014-12-25 DIAGNOSIS — Z1211 Encounter for screening for malignant neoplasm of colon: Secondary | ICD-10-CM

## 2014-12-25 MED ORDER — SUPREP BOWEL PREP KIT 17.5-3.13-1.6 GM/177ML PO SOLN: 1.0000 | Freq: Once | ORAL | Status: DC

## 2014-12-25 NOTE — Progress Notes (Signed)
Patient denies any allergies to egg or soy products. Patient denies complications with anesthesia/sedation.  Patient denies oxygen use at home and denies diet medications. Emmi instructions for colonoscopy explained and given to patient.  

## 2015-01-01 ENCOUNTER — Encounter: Payer: Self-pay | Admitting: Gastroenterology

## 2015-01-03 ENCOUNTER — Encounter: Payer: Self-pay | Admitting: Family Medicine

## 2015-01-03 ENCOUNTER — Ambulatory Visit (INDEPENDENT_AMBULATORY_CARE_PROVIDER_SITE_OTHER): Payer: BLUE CROSS/BLUE SHIELD | Admitting: Family Medicine

## 2015-01-03 VITALS — BP 120/77 | HR 63 | Temp 97.9°F | Resp 20 | Wt 188.0 lb

## 2015-01-03 DIAGNOSIS — R21 Rash and other nonspecific skin eruption: Secondary | ICD-10-CM | POA: Insufficient documentation

## 2015-01-03 MED ORDER — TRIAMCINOLONE ACETONIDE 0.1 % EX CREA: 1.0000 "application " | TOPICAL_CREAM | Freq: Two times a day (BID) | CUTANEOUS | Status: DC

## 2015-01-03 NOTE — Patient Instructions (Signed)
Place steroid cream over areas for 2-4 weeks and then I want to follow up with you if no improvement.

## 2015-01-03 NOTE — Progress Notes (Signed)
   Subjective:    Patient ID: Matthew Sims, male    DOB: 14-Feb-1960, 54 y.o.   MRN: [id number]  HPI   Rash: Patient presents for a new onset of rash occurred approximately 2-3 weeks ago. He reports never having a rash like this in the past. He states it is located on his left thigh and left buttocks. He reports initially they were more red in nature, but has since become more white and scaly. He was trying to put baby lotion on it, which did not seem to help. He states they are pruritic. He has tried taking Benadryl which wasn't helpful. He has not tried any other types of lotions or creams. He denies any insect bites, tick exposure, headaches, fever, nausea, or rash on any other section of his body. He denies any possibility of new exposure to colognes, detergents, shampoos etc. Patient is not on any new medications.   Past Medical History  Diagnosis Date  . Allergy   . Chicken pox   . COPD (chronic obstructive pulmonary disease) ([facility])     no inhaler     Allergies  Allergen Reactions  . Bee Venom Swelling    Swelling around the area / hands / lips Difficulty in swallowing    Review of Systems Negative, with the exception of above mentioned in HPI     Objective:   Physical Exam BP 120/77 mmHg  Pulse 63  Temp(Src) 97.9 F (36.6 C) (Oral)  Resp 20  Wt 188 lb (85.276 kg)  SpO2 96%  Body mass index is 31.28 kg/(m^2). Gen: Afebrile. No acute distress. Nontoxic in appearance, well-developed, well-nourished, Caucasian male. Mildly obese. HENT: AT. [place].  MMM.  Eyes:Pupils Equal Round Reactive to light, Extraocular movements intact,  Conjunctiva without redness, discharge or icterus. Skin: No purpura or petechiae. Dry patchy/scaly mildly erythemic macular papular rash left thigh and buttock. Her chest area approximately 3.5 cm x 3 cm. No central clearing or ring formation.    Assessment & Plan:  1. Rash - DDX: Fungal versus dermatitis. Trial of Kenalog cream, patient encouraged to  use this 2 times a day for the next 2-4 weeks. If rash worsens with use of steroid cream is to discontinue and call in to notify. Discussed today if rash does not resolve in 4 weeks with use of steroid cream, would consider doing a biopsy versus dermatology referral. - triamcinolone cream (KENALOG) 0.1 %; Apply 1 application topically 2 (two) times daily.  Dispense: 30 g; Refill: 0 - Follow-up 3-4 weeks

## 2015-01-08 ENCOUNTER — Encounter: Payer: Self-pay | Admitting: Gastroenterology

## 2015-01-08 ENCOUNTER — Ambulatory Visit (AMBULATORY_SURGERY_CENTER): Payer: BLUE CROSS/BLUE SHIELD | Admitting: Gastroenterology

## 2015-01-08 VITALS — BP 101/73 | HR 60 | Temp 96.7°F | Resp 24 | Ht 65.0 in | Wt 188.0 lb

## 2015-01-08 DIAGNOSIS — D123 Benign neoplasm of transverse colon: Secondary | ICD-10-CM | POA: Diagnosis not present

## 2015-01-08 DIAGNOSIS — D122 Benign neoplasm of ascending colon: Secondary | ICD-10-CM | POA: Diagnosis not present

## 2015-01-08 DIAGNOSIS — K635 Polyp of colon: Secondary | ICD-10-CM

## 2015-01-08 DIAGNOSIS — Z1211 Encounter for screening for malignant neoplasm of colon: Secondary | ICD-10-CM | POA: Diagnosis present

## 2015-01-08 MED ORDER — SODIUM CHLORIDE 0.9 % IV SOLN: 500.0000 mL | INTRAVENOUS | Status: DC

## 2015-01-08 NOTE — Progress Notes (Signed)
A/ox3, pleased with MAC, report to RN 

## 2015-01-08 NOTE — Op Note (Signed)
[facility]  [address] [place], [address]   COLONOSCOPY PROCEDURE REPORT  PATIENT: Matthew, Sims  MR#: [id number] BIRTHDATE: 02-28-1960 , 76  yrs. old GENDER: male ENDOSCOPIST: Milus Banister, MD REFERRED BY: Howard Pouch, MD PROCEDURE DATE:  01/08/2015 PROCEDURE:   Colonoscopy, screening and Colonoscopy with snare polypectomy First Screening Colonoscopy - Avg.  risk and is 50 yrs.  old or older Yes.  Prior Negative Screening - Now for repeat screening. N/A  History of Adenoma - Now for follow-up colonoscopy & has been > or = to 3 yrs.  N/A  Polyps removed today? Yes ASA CLASS:   Class II INDICATIONS:Screening for colonic neoplasia and Colorectal Neoplasm Risk Assessment for this procedure is average risk. MEDICATIONS: Monitored anesthesia care and Propofol 400 mg IV  DESCRIPTION OF PROCEDURE:   After the risks benefits and alternatives of the procedure were thoroughly explained, informed consent was obtained.  The digital rectal exam revealed no abnormalities of the rectum.   The LB [id number]  endoscope was introduced through the anus and advanced to the cecum, which was identified by both the appendix and ileocecal valve. No adverse events experienced.   The quality of the prep was excellent.  The instrument was then slowly withdrawn as the colon was fully examined. Estimated blood loss is zero unless otherwise noted in this procedure report.   COLON FINDINGS: A soft sessile polyp measuring 20 mm in size was found in the ascending colon.  A polypectomy was performed in a piecemeal fashion with a cold snare.  The resection was complete, the polyp tissue was completely retrieved and sent to histology. Five sessile polyps ranging between 5-74mm in size were found in the transverse colon and ascending colon.  Polypectomies were performed with a cold snare.  The resection was complete, the polyp tissue was completely retrieved and sent to histology.    The examination was otherwise normal.  Retroflexed views revealed no abnormalities. The time to cecum = 2.0 Withdrawal time = 23.8   The scope was withdrawn and the procedure completed. COMPLICATIONS: There were no immediate complications.  ENDOSCOPIC IMPRESSION: 1.   Sessile polyp was found in the ascending colon; polypectomy was performed in a piecemeal fashion with a cold snare 2. Five sessile polyps ranging between 5-28mm in size were found in the transverse colon and ascending colon; polypectomies were performed with a cold snare 3.   The examination was otherwise normal  RECOMMENDATIONS: If the polyp(s) removed today are proven to be adenomatous (pre-cancerous) polyps, you will need a colonoscopy in 6 months given piecemeal resection technique needed to resect the larger polyp. You will receive a letter within 1-2 weeks with the results of your biopsy as well as final recommendations.  Please call my office if you have not received a letter after 3 weeks.  eSigned:  Milus Banister, MD 01/08/2015 2:22 PM   PATIENT NAME:  Matthew, Sims MR#: [id number]

## 2015-01-08 NOTE — Patient Instructions (Addendum)
One of your biggest health concerns is your smoking.  This increases your risk for most cancers and serious cardiovascular diseases such as strokes, heart attacks.  You should try your best to stop.  If you need assistance, please contact your PCP or Smoking Cessation Class at [place] ([phone]) or [place] [facility] (1-800-QUIT-NOW).  YOU HAD AN ENDOSCOPIC PROCEDURE TODAY AT THE [facility]:   Refer to the procedure report that was given to you for any specific questions about what was found during the examination.  If the procedure report does not answer your questions, please call your gastroenterologist to clarify.  If you requested that your care partner not be given the details of your procedure findings, then the procedure report has been included in a sealed envelope for you to review at your convenience later.  YOU SHOULD EXPECT: Some feelings of bloating in the abdomen. Passage of more gas than usual.  Walking can help get rid of the air that was put into your GI tract during the procedure and reduce the bloating. If you had a lower endoscopy (such as a colonoscopy or flexible sigmoidoscopy) you may notice spotting of blood in your stool or on the toilet paper. If you underwent a bowel prep for your procedure, you may not have a normal bowel movement for a few days.  Please Note:  You might notice some irritation and congestion in your nose or some drainage.  This is from the oxygen used during your procedure.  There is no need for concern and it should clear up in a day or so.  SYMPTOMS TO REPORT IMMEDIATELY:   Following lower endoscopy (colonoscopy or flexible sigmoidoscopy):  Excessive amounts of blood in the stool  Significant tenderness or worsening of abdominal pains  Swelling of the abdomen that is new, acute  Fever of 100F or higher    For urgent or emergent issues, a gastroenterologist can be reached at any hour by calling [phone].   DIET:  Your first meal following the procedure should be a small meal and then it is ok to progress to your normal diet. Heavy or fried foods are harder to digest and may make you feel nauseous or bloated.  Likewise, meals heavy in dairy and vegetables can increase bloating.  Drink plenty of fluids but you should avoid alcoholic beverages for 24 hours.  ACTIVITY:  You should plan to take it easy for the rest of today and you should NOT DRIVE or use heavy machinery until tomorrow (because of the sedation medicines used during the test).    FOLLOW UP: Our staff will call the number listed on your records the next business day following your procedure to check on you and address any questions or concerns that you may have regarding the information given to you following your procedure. If we do not reach you, we will leave a message.  However, if you are feeling well and you are not experiencing any problems, there is no need to return our call.  We will assume that you have returned to your regular daily activities without incident.  If any biopsies were taken you will be contacted by phone or by letter within the next 1-3 weeks.  Please call us at [phone] if you have not heard about the biopsies in 3 weeks.    SIGNATURES/CONFIDENTIALITY: You and/or your care partner have signed paperwork which will be entered into your electronic medical record.  These signatures attest to the fact   that that the information above on your After Visit Summary has been reviewed and is understood.  Full responsibility of the confidentiality of this discharge information lies with you and/or your care-partner.   Polyp information given.  

## 2015-01-09 ENCOUNTER — Telehealth: Payer: Self-pay

## 2015-01-09 NOTE — Telephone Encounter (Signed)
Left message on answering machine. 

## 2015-01-17 ENCOUNTER — Ambulatory Visit (INDEPENDENT_AMBULATORY_CARE_PROVIDER_SITE_OTHER): Payer: BLUE CROSS/BLUE SHIELD | Admitting: Family Medicine

## 2015-01-17 ENCOUNTER — Encounter: Payer: Self-pay | Admitting: Family Medicine

## 2015-01-17 VITALS — BP 107/63 | HR 65 | Temp 98.0°F | Resp 20 | Wt 188.2 lb

## 2015-01-17 DIAGNOSIS — Z720 Tobacco use: Secondary | ICD-10-CM

## 2015-01-17 DIAGNOSIS — Z9889 Other specified postprocedural states: Secondary | ICD-10-CM

## 2015-01-17 DIAGNOSIS — R21 Rash and other nonspecific skin eruption: Secondary | ICD-10-CM

## 2015-01-17 DIAGNOSIS — Z8719 Personal history of other diseases of the digestive system: Secondary | ICD-10-CM | POA: Insufficient documentation

## 2015-01-17 DIAGNOSIS — Z1211 Encounter for screening for malignant neoplasm of colon: Secondary | ICD-10-CM

## 2015-01-17 NOTE — Progress Notes (Signed)
   Subjective:    Patient ID: Matthew Sims, male    DOB: 15-Aug-1960, 54 y.o.   MRN: [id number]  HPI   Rash: Patient presents to follow-up on rash was located on his thighs and buttock area. Patient was prescribed Kenalog cream to use 2 times a day, which he reports compliance. He states that the areas of concern have completely healed. Patient denies any recurring or new areas of concern.  Umbilical hernia surgery: Patient states that he has had some mild discomfort is location of this umbilical hernia repair. He patient underwent umbilical hernia repair approximate 4 months ago. He states approximately 3 weeks ago he noticed a mild discomfort in the area of his car. He states it feels worse when he puts pressure on the area. He also feels mild pain when lifting something heavy. He denies any changes in skin over the area, difficulty with bowel movements or fever. He denies any mass near the area.   Past Medical History  Diagnosis Date  . Allergy   . Chicken pox   . COPD (chronic obstructive pulmonary disease) (HCC)     no inhaler   Allergies  Allergen Reactions  . Bee Venom Swelling    Swelling around the area / hands / lips Difficulty in swallowing    Review of Systems Negative, with the exception of above mentioned in HPI      Objective:   Physical Exam BP 107/63 mmHg  Pulse 65  Temp(Src) 98 F (36.7 C) (Oral)  Resp 20  Wt 188 lb 4 oz (85.39 kg)  SpO2 98% Gen: Afebrile. No acute distress. Nontoxic in appearance, well-developed, well-nourished, Caucasian male. Pleasant. Abd: Soft. Flat. ND. BS present. No rebound, no guarding. Well-healed umbilical hernia scar. No defect palpated to umbilical hernia repair. Mild scar tissue/fat necrosis right margin of scar. Tender to palpation over scar tissue area only. No defect with Valsalva. Skin: no rashes, purpura or petechiae. Mild hyperpigmentation located in areas where prior rash located. No erythema, no scaling, no additional  lesions.     Assessment & Plan:  1. Tobacco abuse encouraged smoking cessation. Discussed skin condition can be linked to tobacco use.  Pt declined cessation material.   2. Rash - improved/resolved with triamcinolone. Consistent with psoriasis.  - avs provided to pt on psoriasis.  - Pt encouraged to steroid cream with flares only - Encouraged him to use Cetaphil after showers and stop smoking.   3. H/O umbilical hernia repair - No defects in repair on exam. Palpated scar tissue in area of discomfort, very superficial and near incision.  - encouraged him to f/u with surgeon if continued concerns or increase in pain.   Oct CPE

## 2015-01-17 NOTE — Patient Instructions (Signed)
Psoriasis Psoriasis is a long-term (chronic) condition of skin inflammation. It occurs because your immune system causes skin cells to form too quickly. As a result, too many skin cells grow and create raised, red patches (plaques) that look silvery on your skin. Plaques may appear anywhere on your body. They can be any size or shape. Psoriasis can come and go. The condition varies from mild to very severe. It cannot be passed from one person to another (not contagious).  CAUSES  The cause of psoriasis is not known, but certain factors can make the condition worse. These include:   Damage or trauma to the skin, such as cuts, scrapes, sunburn, and dryness.  Lack of sunlight.  Certain medicines.  Alcohol.  Tobacco use.  Stress.  Infections caused by bacteria or viruses. RISK FACTORS This condition is more likely to develop in:  People with a family history of psoriasis.  People who are Caucasian.  People who are between the ages of 15-30 and 50-60 years old. SYMPTOMS  There are five different types of psoriasis. You can have more than one type of psoriasis during your life. Types are:   Plaque.  Guttate.  Inverse.  Pustular.  Erythrodermic. Each type of psoriasis has different symptoms.   Plaque psoriasis symptoms include red, raised plaques with a silvery white coating (scale). These plaques may be itchy. Your nails may be pitted and crumbly or fall off.  Guttate psoriasis symptoms include small red spots that often show up on your trunk, arms, and legs. These spots may develop after you have been sick, especially with strep throat.  Inverse psoriasis symptoms include plaques in your underarm area, under your breasts, or on your genitals, groin, or buttocks.  Pustular psoriasis symptoms include pus-filled bumps that are painful, red, and swollen on the palms of your hands or the soles of your feet. You also may feel exhausted, feverish, weak, or have no  appetite.  Erythrodermic psoriasis symptoms include bright red skin that may look burned. You may have a fast heartbeat and a body temperature that is too high or too low. You may be itchy or in pain. DIAGNOSIS  Your health care provider may suspect psoriasis based on your symptoms and family history. Your health care provider will also do a physical exam. This may include a procedure to remove a tissue sample (biopsy) for testing. You may also be referred to a health care provider who specializes in skin diseases (dermatologist).  TREATMENT There is no cure for this condition, but treatment can help manage it. Goals of treatment include:   Helping your skin heal.  Reducing itching and inflammation.  Slowing the growth of new skin cells.  Helping your immune system respond better to your skin. Treatment varies, depending on the severity of your condition. Treatment may include:   Creams or ointments.  Ultraviolet ray exposure (light therapy). This may include natural sunlight or light therapy in a medical office.  Medicines (systemic therapy). These medicines can help your body better manage skin cell turnover and inflammation. They may be used along with light therapy or ointments. You may also get antibiotic medicines if you have an infection. HOME CARE INSTRUCTIONS Skin Care  Moisturize your skin as needed. Only use moisturizers that have been approved by your health care provider.   Apply cool compresses to the affected areas.   Do not scratch your skin.  Lifestyle  Do not use tobacco products. This includes cigarettes, chewing tobacco, and e-cigarettes. If you   need help quitting, ask your health care provider.  Drink little or no alcohol.   Try techniques for stress reduction, such as meditation or yoga.  Get exposure to the sun as told by your health care provider. Do not get sunburned.   Consider joining a psoriasis support group.  Medicines  Take or use  over-the-counter and prescription medicines only as told by your health care provider.  If you were prescribed an antibiotic, take or use it as told by your health care provider. Do not stop taking the antibiotic even if your condition starts to improve. General Instructions  Keep a journal to help track what triggers an outbreak. Try to avoid any triggers.   See a counselor or social worker if feelings of sadness, frustration, and hopelessness about your condition are interfering with your work and relationships.  Keep all follow-up visits as told by your health care provider. This is important. SEEK MEDICAL CARE IF:  Your pain gets worse.  You have increasing redness or warmth in the affected areas.   You have new or worsening pain or stiffness in your joints.  Your nails start to break easily or pull away from the nail bed.   You have a fever.   You feel depressed.   This information is not intended to replace advice given to you by your health care provider. Make sure you discuss any questions you have with your health care provider.   Document Released: 01/25/2000 Document Revised: 10/18/2014 Document Reviewed: 06/14/2014 Elsevier Interactive Patient Education 2016 [employer].  Try to use cetaphil or cereve cream after showers.  Use triamcinolone cream (steroid cream) when you notice a new lesion.

## 2015-01-18 ENCOUNTER — Encounter: Payer: Self-pay | Admitting: Gastroenterology

## 2015-01-22 ENCOUNTER — Ambulatory Visit (INDEPENDENT_AMBULATORY_CARE_PROVIDER_SITE_OTHER): Payer: Worker's Compensation

## 2015-01-22 ENCOUNTER — Ambulatory Visit (INDEPENDENT_AMBULATORY_CARE_PROVIDER_SITE_OTHER): Payer: Worker's Compensation | Admitting: Family

## 2015-01-22 VITALS — BP 101/61 | HR 66 | Temp 97.9°F | Ht 65.0 in

## 2015-01-22 DIAGNOSIS — M25511 Pain in right shoulder: Secondary | ICD-10-CM

## 2015-01-22 DIAGNOSIS — S7001XA Contusion of right hip, initial encounter: Secondary | ICD-10-CM

## 2015-01-22 DIAGNOSIS — S40011A Contusion of right shoulder, initial encounter: Secondary | ICD-10-CM

## 2015-01-22 DIAGNOSIS — M25521 Pain in right elbow: Secondary | ICD-10-CM

## 2015-01-22 NOTE — Patient Instructions (Signed)

## 2015-01-22 NOTE — Progress Notes (Signed)
   Subjective:    Patient ID: Matthew Sims, male    DOB: [birth year], 54 y.o.   MRN: [id number]  HPI Pt presents to the office today for [facility] today from a fall that occurred on 01/19/15. Pt states he was wallking slipped on a lid and fell on his right side. Pt states he is having an intermittent pain of 3-4 out 10 in his wrist, arm, shoulder, and hip. PT states he feels like he "bruised" it. Pt states his pain increases to 4-5 out 10 if he tries to lift objects.    Review of Systems  Constitutional: Negative.   HENT: Negative.   Respiratory: Negative.   Cardiovascular: Negative.   Gastrointestinal: Negative.   Endocrine: Negative.   Genitourinary: Negative.   Musculoskeletal: Negative.   Neurological: Negative.   Hematological: Negative.   Psychiatric/Behavioral: Negative.   All other systems reviewed and are negative.      Objective:   Physical Exam  Constitutional: He is oriented to person, place, and time. He appears well-developed and well-nourished. No distress.  HENT:  Head: Normocephalic.  Right Ear: External ear normal.  Left Ear: External ear normal.  Nose: Nose normal.  Mouth/Throat: Oropharynx is clear and moist.  Eyes: Pupils are equal, round, and reactive to light. Right eye exhibits no discharge. Left eye exhibits no discharge.  Neck: Normal range of motion. Neck supple. No thyromegaly present.  Cardiovascular: Normal rate, regular rhythm, normal heart sounds and intact distal pulses.   No murmur heard. Pulmonary/Chest: Effort normal and breath sounds normal. No respiratory distress. He has no wheezes.  Abdominal: Soft. Bowel sounds are normal. He exhibits no distension. There is no tenderness.  Musculoskeletal: Normal range of motion. He exhibits no edema or tenderness.  Crepitus noted of  Right shoulder   Neurological: He is alert and oriented to person, place, and time. He has normal reflexes. No cranial nerve deficit.  Skin: Skin is warm and dry. No rash  noted. No erythema.  Psychiatric: He has a normal mood and affect. His behavior is normal. Judgment and thought content normal.  Vitals reviewed.  X-ray- Negative for current fracture Preliminary reading by Evelina Dun, FNP [facility]   BP 101/61 mmHg  Pulse 66  Temp(Src) 97.9 F (36.6 C) (Oral)  Ht 5\' 5"  (1.651 m)        Assessment & Plan:  1. Right elbow pain - DG Elbow 2 Views Right; Future  2. Right shoulder pain - DG Shoulder Right; Future  3. Contusion, hip, right, initial encounter  4. Contusion of right shoulder, initial encounter  Rest Ice Tylenol or Motrin prn as needed Light duty for next 2 days then back to regular job duties RTO prn   Evelina Dun, FNP

## 2015-02-22 ENCOUNTER — Ambulatory Visit (INDEPENDENT_AMBULATORY_CARE_PROVIDER_SITE_OTHER): Payer: Worker's Compensation | Admitting: Family

## 2015-02-22 ENCOUNTER — Encounter: Payer: Self-pay | Admitting: Family

## 2015-02-22 ENCOUNTER — Ambulatory Visit (INDEPENDENT_AMBULATORY_CARE_PROVIDER_SITE_OTHER): Payer: Worker's Compensation

## 2015-02-22 VITALS — BP 101/63 | HR 58 | Temp 97.4°F | Ht 65.0 in | Wt 186.4 lb

## 2015-02-22 DIAGNOSIS — M25561 Pain in right knee: Secondary | ICD-10-CM | POA: Diagnosis not present

## 2015-02-22 NOTE — Progress Notes (Addendum)
   Subjective:    Patient ID: Matthew Sims, male    DOB: 1960/10/10, 55 y.o.   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 PT was diagnosed with contusion of right hip, contusion of right shoulder, and right elbow pain. Pt's  X-ray's were negative and pt was told to take Motrin or Tylenol prn for pain. Pt states he has intermittent pain of a 5 out 10 in his right knee at times. Pt denies any pain in shoulder, hip, or elbow at this time. Pt states his knee "seems to go out" when he bends down and states he can "hear it pop when he moves it back and forth".    Review of Systems  Constitutional: Negative.   HENT: Negative.   Respiratory: Negative.   Cardiovascular: Negative.   Gastrointestinal: Negative.   Endocrine: Negative.   Genitourinary: Negative.   Musculoskeletal: Negative.   Neurological: Negative.   Hematological: Negative.   Psychiatric/Behavioral: Negative.   All other systems reviewed and are negative.      Objective:   Physical Exam  Constitutional: He is oriented to person, place, and time. He appears well-developed and well-nourished. No distress.  HENT:  Head: Normocephalic.  Right Ear: External ear normal.  Left Ear: External ear normal.  Nose: Nose normal.  Mouth/Throat: Oropharynx is clear and moist.  Eyes: Pupils are equal, round, and reactive to light. Right eye exhibits no discharge. Left eye exhibits no discharge.  Neck: Normal range of motion. Neck supple. No thyromegaly present.  Cardiovascular: Normal rate, regular rhythm, normal heart sounds and intact distal pulses.   No murmur heard. Pulmonary/Chest: Effort normal and breath sounds normal. No respiratory distress. He has no wheezes.  Abdominal: Soft. Bowel sounds are normal. He exhibits no distension. There is no tenderness.  Musculoskeletal: Normal range of motion. He exhibits no  edema or tenderness.  Neurological: He is alert and oriented to person, place, and time. He has normal reflexes. No cranial nerve deficit.  Skin: Skin is warm and dry. No rash noted. No erythema.  Psychiatric: He has a normal mood and affect. His behavior is normal. Judgment and thought content normal.  Vitals reviewed.   Knee x-ray- No fracture noted Preliminary reading by Evelina Dun, FNP [facility]   BP 101/63 mmHg  Pulse 58  Temp(Src) 97.4 F (36.3 C) (Oral)  Ht 5\' 5"  (1.651 m)  Wt 186 lb 6.4 oz (84.55 kg)  BMI 31.02 kg/m2     Assessment & Plan:  1. Right knee pain -Rest -ice  -Tylenol or Motrin prn for pain - DG Knee 1-2 Views Right; Future  Evelina Dun, FNP

## 2015-02-22 NOTE — Patient Instructions (Signed)

## 2015-04-16 ENCOUNTER — Telehealth: Payer: Self-pay | Admitting: Gastroenterology

## 2015-04-16 NOTE — Telephone Encounter (Signed)
Left message on machine to call back  

## 2015-04-16 NOTE — Telephone Encounter (Signed)
Pt has been scheduled for office visit to discuss rectal bleeding and recall colon

## 2015-04-17 ENCOUNTER — Telehealth: Payer: Self-pay | Admitting: Gastroenterology

## 2015-04-17 NOTE — Telephone Encounter (Signed)
Pt called to say that he had 2 bowel movements with BRB on the toilet tissue yesterday.  Pt notified to keep appt and call back if bleeding is heavy, if pt experiences, SOB or dizziness.  Pt agreed and will call with any further problems

## 2015-06-12 ENCOUNTER — Ambulatory Visit: Payer: BLUE CROSS/BLUE SHIELD | Admitting: Gastroenterology

## 2015-06-12 ENCOUNTER — Ambulatory Visit (INDEPENDENT_AMBULATORY_CARE_PROVIDER_SITE_OTHER): Payer: BLUE CROSS/BLUE SHIELD | Admitting: Gastroenterology

## 2015-06-12 ENCOUNTER — Encounter: Payer: Self-pay | Admitting: Gastroenterology

## 2015-06-12 VITALS — BP 120/70 | HR 60 | Ht 65.0 in | Wt 186.0 lb

## 2015-06-12 DIAGNOSIS — Z86010 Personal history of colonic polyps: Secondary | ICD-10-CM | POA: Diagnosis not present

## 2015-06-12 DIAGNOSIS — K625 Hemorrhage of anus and rectum: Secondary | ICD-10-CM | POA: Diagnosis not present

## 2015-06-12 NOTE — Progress Notes (Signed)
Review of pertinent gastrointestinal problems: 1. Adenomatous polyps:   Colonoscopy, Dr. Ardis Hughs November 2016 for routine screening. 6 polyps were removed. One was 20 mm, removed in piecemeal fashion. The rest ranged in size from 5-12 mm. The vast majority of these were tubular adenomas without high-grade dysplasia. One was hyperplastic. He was recommended to have recall colonoscopy at 6 month interval given the piecemeal resection technique  HPI: This is a   very pleasant 55 year old man whom I last saw the time of colonoscopy about 6 months ago  Chief complaint is rectal bleeding  Rarely sees blood stool.  5-6 episodes of bleeding since the procedure.  Once there was a lot of blood, wrapped around a stool.  Never dripping blood.  He doesn't usually have to strain or push to have BM  Has BM every 2-3 days.  No abd pains.  He had no associated anal discomfort.  Past Medical History  Diagnosis Date  . Allergy   . Chicken pox   . COPD (chronic obstructive pulmonary disease) (HCC)     no inhaler  . Colon polyps     Past Surgical History  Procedure Laterality Date  . Shoulder surgery    . Hernia repair  [id number]    umbilical hernia  . Tonsillectomy  1966  . Rotator cuff tear Left 2001  . Rotator cuff repair Right 2000, 2010    Current Outpatient Prescriptions  Medication Sig Dispense Refill  . EPINEPHrine 0.3 mg/0.3 mL IJ SOAJ injection Inject 0.3 mLs (0.3 mg total) into the muscle once. 1 Device 0   No current facility-administered medications for this visit.    Allergies as of 06/12/2015 - Review Complete 06/12/2015  Allergen Reaction Noted  . Bee venom Swelling 09/05/2012    Family History  Problem Relation Age of Onset  . Breast cancer Mother   . COPD Mother   . Alcohol abuse Maternal Grandfather   . Colon cancer Neg Hx   . Colon polyps Neg Hx   . Esophageal cancer Neg Hx   . Rectal cancer Neg Hx   . Stomach cancer Neg Hx     Social History   Social  History  . Marital Status: Single    Spouse Name: N/A  . Number of Children: N/A  . Years of Education: N/A   Occupational History  . Not on file.   Social History Main Topics  . Smoking status: Current Every Day Smoker -- 1.00 packs/day for 43 years    Types: Cigarettes  . Smokeless tobacco: Never Used  . 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 Drug Use: No  . Sexual Activity: Not on file   Other Topics Concern  . Not on file   Social History Narrative   Single. Employed. High school education.    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   Smokes daily.    Wears seat belt.    Exercises  3 times a week.    Smoke alarm in the home.    Guns in the home.      Physical Exam: BP 120/70 mmHg  Pulse 60  Ht 5\' 5"  (1.651 m)  Wt 186 lb (84.369 kg)  BMI 30.95 kg/m2 Constitutional: generally well-appearing Psychiatric: alert and oriented x3 Abdomen: soft, nontender, nondistended, no obvious ascites, no peritoneal signs, normal bowel sounds Rectal examination: Small amount of deflated hemorrhoidal tissue at the anus externally, no anal fissures, digital rectal  exam revealed no distal rectal masses.  Assessment and plan: 55 y.o. male with minor rectal bleeding likely hemorrhoidal, history of piecemeal resected adenoma  I explained to him that his minor rectal bleeding was likely from hemorrhoids. These can come and go. If it is just a minor amount of bleeding once or twice a month he should not be alarmed by it. If it gets much more than that he knows to call. He has had no associated anal discomfort. He does not strain to push to move his bowels. He is due for colonoscopy in the next month or 2 for piecemeal resected adenoma removed November of last year. We will schedule that at his soonest convenience.   Owens Loffler, MD [facility] 06/12/2015, 9:03 AM

## 2015-06-12 NOTE — Patient Instructions (Signed)
You will be set up for a colonoscopy for polyp surveillance (12/2014 piecemeal resected polyp).

## 2015-06-18 ENCOUNTER — Encounter: Payer: Self-pay | Admitting: Gastroenterology

## 2015-06-18 ENCOUNTER — Ambulatory Visit (AMBULATORY_SURGERY_CENTER): Payer: BLUE CROSS/BLUE SHIELD | Admitting: Gastroenterology

## 2015-06-18 VITALS — BP 111/73 | HR 52 | Temp 97.3°F | Resp 10 | Ht 65.0 in | Wt 186.0 lb

## 2015-06-18 DIAGNOSIS — D120 Benign neoplasm of cecum: Secondary | ICD-10-CM

## 2015-06-18 DIAGNOSIS — Z86010 Personal history of colonic polyps: Secondary | ICD-10-CM

## 2015-06-18 DIAGNOSIS — D122 Benign neoplasm of ascending colon: Secondary | ICD-10-CM

## 2015-06-18 MED ORDER — SODIUM CHLORIDE 0.9 % IV SOLN: 500.0000 mL | INTRAVENOUS | Status: DC

## 2015-06-18 NOTE — Op Note (Signed)
[facility] Patient Name: Matthew Sims Procedure Date: 06/18/2015 7:50 AM MRN: [id number] Endoscopist: Milus Banister , MD Age: 55 Date of Birth: [birth year] Gender: Male Procedure:                Colonoscopy Indications:              High risk colon cancer surveillance: Personal                            history of colonic polyps (six polyps removed 2016,                            one was 2cm TA removed piecemeal fashion from                            ascending colon) Medicines:                Monitored Anesthesia Care Procedure:                Pre-Anesthesia Assessment:                           - Prior to the procedure, a History and Physical                            was performed, and patient medications and                            allergies were reviewed. The patient's tolerance of                            previous anesthesia was also reviewed. The risks                            and benefits of the procedure and the sedation                            options and risks were discussed with the patient.                            All questions were answered, and informed consent                            was obtained. Prior Anticoagulants: The patient has                            taken no previous anticoagulant or antiplatelet                            agents. ASA Grade Assessment: II - A patient with                            mild systemic disease. After reviewing the risks  and benefits, the patient was deemed in                            satisfactory condition to undergo the procedure.                           After obtaining informed consent, the colonoscope                            was passed under direct vision. Throughout the                            procedure, the patient's blood pressure, pulse, and                            oxygen saturations were monitored continuously. The                            Model  CF-[id number]) scope was introduced                            through the anus and advanced to the the cecum,                            identified by appendiceal orifice and ileocecal                            valve. The colonoscopy was performed without                            difficulty. The patient tolerated the procedure                            well. The quality of the bowel preparation was                            excellent. The ileocecal valve, appendiceal                            orifice, and rectum were photographed. Scope In: 8:00:10 AM Scope Out: 8:08:38 AM Scope Withdrawal Time: 0 hours 6 minutes 44 seconds  Total Procedure Duration: 0 hours 8 minutes 28 seconds  Findings:                 A 2 mm polyp was found in the cecum. The polyp was                            sessile. The polyp was removed with a cold biopsy                            forceps. Resection and retrieval were complete.                           A 6 mm polyp was found in  the proximal ascending                            colon (this was clearly residual polyp from                            previous 2016 resection, evident by associated                            mucosal scar) The polyp was sessile. The polyp was                            removed with a hot snare. Resection and retrieval                            were complete.                           The exam was otherwise without abnormality on                            direct and retroflexion views. Complications:            No immediate complications. Estimated blood loss:                            None. Estimated Blood Loss:     Estimated blood loss: none. Impression:               - One 2 mm polyp in the cecum, removed with a cold                            biopsy forceps. Resected and retrieved.                           - One 6 mm (residual) polyp in the proximal                            ascending colon, removed with a hot  snare. Resected                            and retrieved.                           - The examination was otherwise normal on direct                            and retroflexion views. Recommendation:           - Patient has a contact number available for                            emergencies. The signs and symptoms of potential                            delayed complications were discussed with the  patient. Return to normal activities tomorrow.                            Written discharge instructions were provided to the                            patient.                           - Resume previous diet.                           - Continue present medications.                           You will receive a letter within 2-3 weeks with the                            pathology results and my final recommendations.                           If the polyp(s) is proven to be 'pre-cancerous' on                            pathology, you will need repeat colonoscopy in 3                            years. Milus Banister, MD 06/18/2015 8:12:20 AM This report has been signed electronically.

## 2015-06-18 NOTE — Progress Notes (Signed)
Called to room to assist during endoscopic procedure.  Patient ID and intended procedure confirmed with present staff. Received instructions for my participation in the procedure from the performing physician.  

## 2015-06-18 NOTE — Patient Instructions (Signed)
Discharge instructions given. Handout on polyps. Resume previous medications. YOU HAD AN ENDOSCOPIC PROCEDURE TODAY AT THE [facility]:   Refer to the procedure report that was given to you for any specific questions about what was found during the examination.  If the procedure report does not answer your questions, please call your gastroenterologist to clarify.  If you requested that your care partner not be given the details of your procedure findings, then the procedure report has been included in a sealed envelope for you to review at your convenience later.  YOU SHOULD EXPECT: Some feelings of bloating in the abdomen. Passage of more gas than usual.  Walking can help get rid of the air that was put into your GI tract during the procedure and reduce the bloating. If you had a lower endoscopy (such as a colonoscopy or flexible sigmoidoscopy) you may notice spotting of blood in your stool or on the toilet paper. If you underwent a bowel prep for your procedure, you may not have a normal bowel movement for a few days.  Please Note:  You might notice some irritation and congestion in your nose or some drainage.  This is from the oxygen used during your procedure.  There is no need for concern and it should clear up in a day or so.  SYMPTOMS TO REPORT IMMEDIATELY:   Following lower endoscopy (colonoscopy or flexible sigmoidoscopy):  Excessive amounts of blood in the stool  Significant tenderness or worsening of abdominal pains  Swelling of the abdomen that is new, acute  Fever of 100F or higher   For urgent or emergent issues, a gastroenterologist can be reached at any hour by calling [phone].   DIET: Your first meal following the procedure should be a small meal and then it is ok to progress to your normal diet. Heavy or fried foods are harder to digest and may make you feel nauseous or bloated.  Likewise, meals heavy in dairy and vegetables can increase bloating.  Drink  plenty of fluids but you should avoid alcoholic beverages for 24 hours.  ACTIVITY:  You should plan to take it easy for the rest of today and you should NOT DRIVE or use heavy machinery until tomorrow (because of the sedation medicines used during the test).    FOLLOW UP: Our staff will call the number listed on your records the next business day following your procedure to check on you and address any questions or concerns that you may have regarding the information given to you following your procedure. If we do not reach you, we will leave a message.  However, if you are feeling well and you are not experiencing any problems, there is no need to return our call.  We will assume that you have returned to your regular daily activities without incident.  If any biopsies were taken you will be contacted by phone or by letter within the next 1-3 weeks.  Please call us at [phone] if you have not heard about the biopsies in 3 weeks.    SIGNATURES/CONFIDENTIALITY: You and/or your care partner have signed paperwork which will be entered into your electronic medical record.  These signatures attest to the fact that that the information above on your After Visit Summary has been reviewed and is understood.  Full responsibility of the confidentiality of this discharge information lies with you and/or your care-partner. 

## 2015-06-18 NOTE — Progress Notes (Signed)
Report to PACU, RN, vss, BBS= Clear.  

## 2015-06-19 ENCOUNTER — Telehealth: Payer: Self-pay

## 2015-06-19 NOTE — Telephone Encounter (Signed)
  Follow up Call-  Call back number 06/18/2015 01/08/2015  Post procedure Call Back phone  # [phone]  Permission to leave phone message Yes Yes     Patient was called for follow up after his procedure on 05/08/217. No answer at the number given for follow up phone call. A message was left on the answering machine.

## 2015-06-25 ENCOUNTER — Encounter: Payer: Self-pay | Admitting: Gastroenterology

## 2015-08-27 ENCOUNTER — Telehealth: Payer: Self-pay | Admitting: Family Medicine

## 2015-08-27 NOTE — Telephone Encounter (Signed)
Patient called to schedule an appointment for stomach issues but then mentioned he was having chest pains last Thursday. Transferred call to [facility].

## 2015-08-27 NOTE — Telephone Encounter (Signed)
Patient has scheduled appt 

## 2015-08-27 NOTE — Telephone Encounter (Signed)
Patient Name: Matthew Sims  DOB: March 18, 1960    Initial Comment been having chest pains and some arm numbness. not going on at the moment. sometimes they severe, had aj minor one this morning. symptoms been going on for about 2 months    Nurse Assessment  Nurse: Raphael Gibney, RN, Vanita Ingles Date/Time Eilene Ghazi Time): 08/27/2015 11:29:30 AM  Confirm and document reason for call. If symptomatic, describe symptoms. You must click the next button to save text entered. ---Caller states he has been having chest pain and chest pain on Thursday evening was severe and made his left arm go numb. Had some minor chest pain since Thursday evening. Had episode of chest pain this am that lasted a couple of seconds.  Has the patient traveled out of the country within the last 30 days? ---Not Applicable  Does the patient have any new or worsening symptoms? ---Yes  Will a triage be completed? ---Yes  Related visit to physician within the last 2 weeks? ---No  Does the PT have any chronic conditions? (i.e. diabetes, asthma, etc.) ---No  Is this a behavioral health or substance abuse call? ---No     Guidelines    Guideline Title Affirmed Question Affirmed Notes  Chest Pain [1] Chest pain lasts > 5 minutes AND [2] occurred > 3 days ago (72 hours) AND [3] NO chest pain or cardiac symptoms now    Final Disposition User   See Physician within 24 Hours [place], RN, Vanita Ingles    Comments  caller called back, xferred to urg  appt scheduled for 08/28/15 at 8:30 am with Dr. Howard Pouch   Referrals  REFERRED TO PCP OFFICE   Disagree/Comply: Comply

## 2015-08-28 ENCOUNTER — Ambulatory Visit (INDEPENDENT_AMBULATORY_CARE_PROVIDER_SITE_OTHER): Payer: BLUE CROSS/BLUE SHIELD | Admitting: Family Medicine

## 2015-08-28 ENCOUNTER — Other Ambulatory Visit: Payer: Self-pay | Admitting: *Deleted

## 2015-08-28 ENCOUNTER — Ambulatory Visit (HOSPITAL_BASED_OUTPATIENT_CLINIC_OR_DEPARTMENT_OTHER)
Admission: RE | Admit: 2015-08-28 | Discharge: 2015-08-28 | Disposition: A | Discharge status: Home / Self Care | Payer: BLUE CROSS/BLUE SHIELD | Source: Ambulatory Visit | Attending: Family Medicine | Admitting: Family Medicine

## 2015-08-28 ENCOUNTER — Encounter: Payer: Self-pay | Admitting: Family Medicine

## 2015-08-28 VITALS — BP 111/75 | HR 57 | Temp 98.0°F | Resp 20 | Ht 65.0 in | Wt 192.2 lb

## 2015-08-28 DIAGNOSIS — R079 Chest pain, unspecified: Secondary | ICD-10-CM | POA: Diagnosis not present

## 2015-08-28 DIAGNOSIS — J449 Chronic obstructive pulmonary disease, unspecified: Secondary | ICD-10-CM | POA: Diagnosis not present

## 2015-08-28 DIAGNOSIS — T7840XA Allergy, unspecified, initial encounter: Secondary | ICD-10-CM

## 2015-08-28 DIAGNOSIS — Z720 Tobacco use: Secondary | ICD-10-CM | POA: Diagnosis not present

## 2015-08-28 LAB — COMPREHENSIVE METABOLIC PANEL
ALT: 15 U/L (ref 0–53)
AST: 17 U/L (ref 0–37)
Albumin: 3.8 g/dL (ref 3.5–5.2)
Alkaline Phosphatase: 64 U/L (ref 39–117)
BUN: 10 mg/dL (ref 6–23)
CO2: 29 mEq/L (ref 19–32)
Calcium: 9.1 mg/dL (ref 8.4–10.5)
Chloride: 105 mEq/L (ref 96–112)
Creatinine, Ser: 0.84 mg/dL (ref 0.40–1.50)
GFR: 100.13 mL/min (ref >60.00)
Glucose, Bld: 93 mg/dL (ref 70–99)
Potassium: 4.2 mEq/L (ref 3.5–5.1)
Sodium: 139 mEq/L (ref 135–145)
Total Bilirubin: 0.6 mg/dL (ref 0.2–1.2)
Total Protein: 6.2 g/dL (ref 6.0–8.3)

## 2015-08-28 LAB — TSH: TSH: 1.74 u[IU]/mL (ref 0.35–4.50)

## 2015-08-28 LAB — MAGNESIUM: Magnesium: 2 mg/dL (ref 1.5–2.5)

## 2015-08-28 MED ORDER — EPINEPHRINE 0.3 MG/0.3ML IJ SOAJ: 0.3000 mg | Freq: Once | INTRAMUSCULAR | Status: DC

## 2015-08-28 NOTE — Telephone Encounter (Signed)
Patient insurance requires generic Epi Pen manufactured by [employer]. New Rx phoned in

## 2015-08-28 NOTE — Progress Notes (Signed)
Patient ID: Matthew Sims, male   DOB: 1960-10-16, 55 y.o.   MRN: [id number]    Matthew Sims , 11-03-1960, 55 y.o., male MRN: [id number] Patient Care Team    Relationship Specialty Notifications Start End  Ma Hillock, DO PCP - General Family Medicine  06/01/15     CC: chest pain  Subjective: Pt presents for an acute OV with complaints of chest pain of "quite a while, maybe a few months." He states chest pain is not associated with any particular activity and can come and go even when he is laying down at night to when he is at work. He points to his lower left chest area as location of pain. He states the pain last about 5 minutes and then self resolve. Thursday he experienced the same type of chest pain while sitting down, but this time he also experienced left arm "tingling" with it. It again lasted about 5 minutes. He denies neck pain, injury or arthritis.  He is a heavy smoker and obese. He was an alcoholic, but successfully quit 5 years ago. No Fhx of heart disease he is aware of. CXR 2011: Emphysema changes.     Allergies  Allergen Reactions  . Bee Venom Swelling    Swelling around the area / hands / lips Difficulty in swallowing   Social History  Substance Use Topics  . Smoking status: Current Every Day Smoker -- 1.00 packs/day for 43 years    Types: Cigarettes  . Smokeless tobacco: Never Used  . Alcohol Use: No     Comment: sober since 2012   Past Medical History  Diagnosis Date  . Allergy   . Chicken pox   . COPD (chronic obstructive pulmonary disease) (HCC)     no inhaler  . Colon polyps    Past Surgical History  Procedure Laterality Date  . Shoulder surgery    . Hernia repair  [id number]    umbilical hernia  . Tonsillectomy  1966  . Rotator cuff tear Left 2001  . Rotator cuff repair Right 2000, 2010   Family History  Problem Relation Age of Onset  . Breast cancer Mother   . COPD Mother   . Alcohol abuse Maternal Grandfather   . Colon cancer Neg Hx   . Colon  polyps Neg Hx   . Esophageal cancer Neg Hx   . Rectal cancer Neg Hx   . Stomach cancer Neg Hx      Medication List       This list is accurate as of: 08/28/15  8:35 AM.  Always use your most recent med list.               EPINEPHrine 0.3 mg/0.3 mL Soaj injection  Commonly known as:  EPI-PEN  Inject 0.3 mLs (0.3 mg total) into the muscle once.        No results found for this or any previous visit (from the past 24 hour(s)). No results found.   ROS: Negative, with the exception of above mentioned in HPI   Objective:  BP 111/75 mmHg  Pulse 57  Temp(Src) 98 F (36.7 C)  Resp 20  Ht 5\' 5"  (1.651 m)  Wt 192 lb 4 oz (87.204 kg)  BMI 31.99 kg/m2  SpO2 95% Body mass index is 31.99 kg/(m^2). Gen: Afebrile. No acute distress. Nontoxic in appearance, well developed, well nourished. Obese caucasian male.  HENT: AT. MMM, no oral lesions.  Eyes:Pupils Equal Round Reactive to light, Extraocular movements intact,  Conjunctiva without redness, discharge or icterus. Neck/lymp/endocrine: Supple,no  lymphadenopathy CV: RRR no murmur, no edema. No chest wall tenderness.  Chest: CTAB, no wheeze or crackles. Good air movement, normal resp effort.  Abd: Soft. obese. NTND. BS present. No  Masses palpated.  Skin: no rashes, purpura or petechiae.  Neuro:  Normal gait. PERLA. EOMi. Alert. Oriented x3  Psych: Normal affect, dress and demeanor. Normal speech. Normal thought content and judgment. EKG: HR 58, PR 160, Qtc 405. No ST/T wave changes from prior EKG  Assessment/Plan: Matthew Sims is a 55 y.o. male present for acute OV for  1. Chest pain, unspecified chest pain type - concern for cardiac cause, heavy smoker. Discussed referral to cards for evaluation.  - smoking cessation encouraged.  - DG Chest 2 View; Future - TSH - CBC w/Diff - Comprehensive metabolic panel - Magnesium - Ambulatory referral to Cardiology - EKG- unchanged. - discussed emergent evaluation if chest pain  occurs.    Greater than 40 minutes spent with patient, >50% of time spent face to face counseling patient and coordinating care.   electronically signed by:  Howard Pouch, DO  [place]

## 2015-08-28 NOTE — Patient Instructions (Signed)
Angina Pectoris Angina pectoris, often called angina, is extreme discomfort in the chest, neck, or arm. This is caused by a lack of blood in the middle and thickest layer of the heart wall (myocardium). There are four types of angina:  Stable angina. Stable angina usually occurs in episodes of predictable frequency and duration. It is usually brought on by physical activity, stress, or excitement. Stable angina usually lasts a few minutes and can often be relieved by a medicine that you place under your tongue. This medicine is called sublingual nitroglycerin.  Unstable angina. Unstable angina can occur even when you are doing little or no physical activity. It can even occur while you are sleeping or when you are at rest. It can suddenly increase in severity or frequency. It may not be relieved by sublingual nitroglycerin, and it can last up to 30 minutes.  Microvascular angina. This type of angina is caused by a disorder of tiny blood vessels called arterioles. Microvascular angina is more common in women. The pain may be more severe and last longer than other types of angina pectoris.  Prinzmetal or variant angina. This type of angina pectoris is rare and usually occurs when you are doing little or no physical activity. It especially occurs in the early morning hours. CAUSES Atherosclerosis is the cause of angina. This is the buildup of fat and cholesterol (plaque) on the inside of the arteries. Over time, the plaque may narrow or block the artery, and this will lessen blood flow to the heart. Plaque can also become weak and break off within a coronary artery to form a clot and cause a sudden blockage. RISK FACTORS Risk factors common to both men and women include:  High cholesterol levels.  High blood pressure (hypertension).  Tobacco use.  Diabetes.  Family history of angina.  Obesity.  Lack of exercise.  A diet high in saturated fats. Women are at greater risk for angina if they  are:  Over age 9.  Postmenopausal. SYMPTOMS Many people do not experience any symptoms during the early stages of angina. As the condition progresses, symptoms common to both men and women may include:  Chest pain.  The pain can be described as a crushing or squeezing in the chest, or a tightness, pressure, fullness, or heaviness in the chest.  The pain can last more than a few minutes, or it can stop and recur.  Pain in the arms, neck, jaw, or back.  Unexplained heartburn or indigestion.  Shortness of breath.  Nausea.  Sudden cold sweats.  Sudden light-headedness. Many women have chest discomfort and some of the other symptoms. However, women often have different (atypical) symptoms, such as:   Fatigue.  Unexplained feelings of nervousness or anxiety.  Unexplained weakness.  Dizziness or fainting. Sometimes, women may have angina without any symptoms. DIAGNOSIS  Tests to diagnose angina may include:  ECG (electrocardiogram).  Exercise stress test. This looks for signs of blockage when the heart is being exercised.  Pharmacologic stress test. This test looks for signs of blockage when the heart is being stressed with a medicine.  Blood tests.  Coronary angiogram. This is a procedure to look at the coronary arteries to see if there is any blockage. TREATMENT  The treatment of angina may include the following:  Healthy behavioral changes to reduce or control risk factors.  Medicine.  Coronary stenting.A stent helps to keep an artery open.  Coronary angioplasty. This procedure widens a narrowed or blocked artery.  Coronary arterybypass  TREATMENT   The treatment of angina may include the following:  · Healthy behavioral changes to reduce or control risk factors.  · Medicine.  · Coronary stenting. A stent helps to keep an artery open.  · Coronary angioplasty. This procedure widens a narrowed or blocked artery.  · Coronary artery bypass surgery. This will allow your blood to pass the blockage (bypass) to reach your heart.  HOME CARE INSTRUCTIONS   · Take medicines only as directed by your health care provider.  · Do not take the following medicines unless your health care provider approves:    Nonsteroidal anti-inflammatory drugs (NSAIDs), such as ibuprofen,  naproxen, or celecoxib.    Vitamin supplements that contain vitamin A, vitamin E, or both.    Hormone replacement therapy that contains estrogen with or without progestin.  · Manage other health conditions such as hypertension and diabetes as directed by your health care provider.  · Follow a heart-healthy diet. A dietitian can help to educate you about healthy food options and changes.  · Use healthy cooking methods such as roasting, grilling, broiling, baking, poaching, steaming, or stir-frying. Talk to a dietitian to learn more about healthy cooking methods.  · Follow an exercise program approved by your health care provider.  · Maintain a healthy weight. Lose weight as approved by your health care provider.  · Plan rest periods when fatigued.  · Learn to manage stress.  · Do not use any tobacco products, including cigarettes, chewing tobacco, or electronic cigarettes. If you need help quitting, ask your health care provider.  · If you drink alcohol, and your health care provider approves, limit your alcohol intake to no more than 1 drink per day. One drink equals 12 ounces of beer, 5 ounces of wine, or 1½ ounces of hard liquor.  · Stop illegal drug use.  · Keep all follow-up visits as directed by your health care provider. This is important.  SEEK IMMEDIATE MEDICAL CARE IF:   · You have pain in your chest, neck, arm, jaw, stomach, or back that lasts more than a few minutes, is recurring, or is unrelieved by taking sublingual nitroglycerin.  · You have profuse sweating without cause.  · You have unexplained:    Heartburn or indigestion.    Shortness of breath or difficulty breathing.    Nausea or vomiting.    Fatigue.    Feelings of nervousness or anxiety.    Weakness.    Diarrhea.  · You have sudden light-headedness or dizziness.  · You faint.  These symptoms may represent a serious problem that is an emergency. Do not wait to see if the symptoms will go away. Get medical help right away. Call your local  emergency services (911 in the [place] Do not drive yourself to the hospital.     This information is not intended to replace advice given to you by your health care provider. Make sure you discuss any questions you have with your health care provider.     Document Released: 01/27/2005 Document Revised: 02/17/2014 Document Reviewed: 05/31/2013  Elsevier Interactive Patient Education ©2016 [employer]

## 2015-08-29 ENCOUNTER — Telehealth: Payer: Self-pay | Admitting: Family Medicine

## 2015-08-29 LAB — CBC WITH DIFFERENTIAL/PLATELET
Basophils Absolute: 0 10*3/uL (ref 0.0–0.1)
Basophils Relative: 0.2 % (ref 0.0–3.0)
Eosinophils Absolute: 0.1 10*3/uL (ref 0.0–0.7)
Eosinophils Relative: 2 % (ref 0.0–5.0)
HCT: 46.6 % (ref 39.0–52.0)
Hemoglobin: 15.7 g/dL (ref 13.0–17.0)
Lymphocytes Relative: 21.1 % (ref 12.0–46.0)
Lymphs Abs: 1.3 10*3/uL (ref 0.7–4.0)
MCHC: 33.8 g/dL (ref 30.0–36.0)
MCV: 89.7 fl (ref 78.0–100.0)
Monocytes Absolute: 0.4 10*3/uL (ref 0.1–1.0)
Monocytes Relative: 6.6 % (ref 3.0–12.0)
Neutro Abs: 4.4 10*3/uL (ref 1.4–7.7)
Neutrophils Relative %: 70.1 % (ref 43.0–77.0)
Platelets: 254 10*3/uL (ref 150.0–400.0)
RBC: 5.19 Mil/uL (ref 4.22–5.81)
RDW: 14.4 % (ref 11.5–15.5)
WBC: 6.3 10*3/uL (ref 4.0–10.5)

## 2015-08-29 NOTE — Telephone Encounter (Signed)
Please call pt: - all labs were normal.  - CXR did show signs of his chronic smoking.  - I have placed a referral to cardiology for work up.

## 2015-08-29 NOTE — Telephone Encounter (Signed)
Left message with results and information on patient voice mail per DPR. 

## 2015-09-20 ENCOUNTER — Encounter: Payer: Self-pay | Admitting: Cardiology

## 2015-10-01 NOTE — Progress Notes (Signed)
Cardiology Office Note   Date:  10/05/2015   ID:  Matthew Sims, DOB 26-Oct-1960, MRN [id number]  PCP:  Howard Pouch, DO  Cardiologist:   Minus Breeding, MD   Chief Complaint  Patient presents with  . Chest Pain      History of Present Illness: Matthew Sims is a 55 y.o. male who presents for evaluation of chest pain.  He is new to this practice.  He presents for evaluation of chest discomfort that started about 3 or 4 months ago. She has been sporadic. He doesn't think it's increased in frequency or intensity. It happens at rest.  He's able to do active work all day long and not bring this on. He does have difficulties with certain foods but he doesn't think this feels the same as that. This is a mid sternal discomfort that can be up to 5 out of 10 in intensity. There might be some mild shortness of breath. It might last for 10-25 minutes. He doesn't have any jaw pain or arm pain. He denies any PND or orthopnea. He denies any palpitations, presyncope or syncope. 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   Past Medical History:  Diagnosis Date  . Chicken pox   . Colon polyps   . COPD (chronic obstructive pulmonary disease) (HCC)    no inhaler    Past Surgical History:  Procedure Laterality Date  . HERNIA REPAIR  [id number]   umbilical hernia  . ROTATOR CUFF REPAIR Right 2000, 2010  . rotator cuff tear Left 2001  . SHOULDER SURGERY    . TONSILLECTOMY  1966     Current Outpatient Prescriptions  Medication Sig Dispense Refill  . EPINEPHrine 0.3 mg/0.3 mL IJ SOAJ injection Inject 0.3 mLs (0.3 mg total) into the muscle once. 1 Device 0   No current facility-administered medications for this visit.     Allergies:   Bee venom    Social History:  The patient  reports that he has been smoking Cigarettes.  He has a 43.00 pack-year smoking history. He has never used smokeless tobacco. He reports that he does not drink alcohol or use drugs.     Family History:  The patient's family history includes Alcohol abuse in his maternal grandfather; Breast cancer in his mother; COPD in his mother.    ROS:  Please see the history of present illness.   Otherwise, review of systems are positive for bronchitis recently.   All other systems are reviewed and negative.    PHYSICAL EXAM: VS:  BP 122/75   Pulse 74   Ht 5\' 6"  (1.676 m)   Wt 190 lb 9.6 oz (86.5 kg)   BMI 30.76 kg/m  , BMI Body mass index is 30.76 kg/m. GENERAL:  Well appearing HEENT:  Pupils equal round and reactive, fundi not visualized, oral mucosa unremarkable, poor dentition.  NECK:  No jugular venous distention, waveform within normal limits, carotid upstroke brisk and symmetric, no bruits, no thyromegaly LYMPHATICS:  No cervical, inguinal adenopathy LUNGS:  Clear to auscultation bilaterally BACK:  No CVA tenderness CHEST:  Unremarkable HEART:  PMI not displaced or sustained,S1 and S2 within normal limits, no S3, no S4, no clicks, no rubs, no murmurs ABD:  Flat, positive bowel sounds normal in frequency in pitch, no bruits, no rebound, no guarding, no midline pulsatile mass, no hepatomegaly, no splenomegaly EXT:  2 plus pulses throughout, no edema, no cyanosis no clubbing SKIN:  No rashes no nodules NEURO:  Cranial nerves II through XII grossly intact, motor grossly intact throughout PSYCH:  Cognitively intact, oriented to person place and time    EKG:  EKG is ordered today. The ekg ordered today demonstrates sinus rhythm, rate 74, axis within normal limits, intervals within normal limits, no acute ST.   Recent Labs: 08/28/2015: ALT 15; BUN 10; Creatinine, Ser 0.84; Hemoglobin 15.7; Magnesium 2.0; Platelets 254.0; Potassium 4.2; Sodium 139; TSH 1.74    Lipid Panel    Component Value Date/Time   CHOL 142 11/16/2014 1144   CHOL 151 01/26/2013 1432   TRIG 45.0 11/16/2014 1144   HDL 55.50 11/16/2014 1144   HDL 48 01/26/2013 1432   CHOLHDL 3 11/16/2014 1144    VLDL 9.0 11/16/2014 1144   LDLCALC 78 11/16/2014 1144   LDLCALC 79 01/26/2013 1432      Wt Readings from Last 3 Encounters:  10/04/15 190 lb 9.6 oz (86.5 kg)  08/28/15 192 lb 4 oz (87.2 kg)  06/18/15 186 lb (84.4 kg)      Other studies Reviewed: Additional studies/ records that were reviewed today include: Labs. Review of the above records demonstrates:  Please see elsewhere in the note.     ASSESSMENT AND PLAN:  CHEST PAIN:  The pain has some typical and atypical features. He has a significant risk factor with his smoking. I think the pretest probability of obstructive coronary disease is moderate. I would like to screen him with an exercise perfusion study. Further evaluation based on this result.  TOBACCO ABUSE:   He has a remarkable smoking history and he is cutting back but is not able to quit at this point and we did talk about this.   Current medicines are reviewed at length with the patient today.  The patient does not have concerns regarding medicines.  The following changes have been made:  no change  Labs/ tests ordered today include:   Orders Placed This Encounter  Procedures  . Myocardial Perfusion Imaging  . EKG 12-Lead     Disposition:   FU with me in two months.      Signed, Minus Breeding, MD  10/05/2015 7:31 AM    [facility]

## 2015-10-04 ENCOUNTER — Ambulatory Visit (INDEPENDENT_AMBULATORY_CARE_PROVIDER_SITE_OTHER): Payer: BLUE CROSS/BLUE SHIELD | Admitting: Cardiology

## 2015-10-04 ENCOUNTER — Encounter: Payer: Self-pay | Admitting: Cardiology

## 2015-10-04 VITALS — BP 122/75 | HR 74 | Ht 66.0 in | Wt 190.6 lb

## 2015-10-04 DIAGNOSIS — R0789 Other chest pain: Secondary | ICD-10-CM

## 2015-10-04 DIAGNOSIS — Z720 Tobacco use: Secondary | ICD-10-CM | POA: Diagnosis not present

## 2015-10-04 NOTE — Patient Instructions (Signed)
Medication Instructions:  Continue current medications  Labwork: None Ordered  Testing/Procedures: None Ordered  Follow-Up: Your physician recommends that you schedule a follow-up appointment in: 2 Months in [place]   Any Other Special Instructions Will Be Listed Below (If Applicable).   If you need a refill on your cardiac medications before your next appointment, please call your pharmacy.   

## 2015-10-11 ENCOUNTER — Telehealth (HOSPITAL_COMMUNITY): Payer: Self-pay

## 2015-10-11 NOTE — Telephone Encounter (Signed)
Encounter complete. 

## 2015-10-16 ENCOUNTER — Ambulatory Visit (HOSPITAL_COMMUNITY)
Admission: RE | Admit: 2015-10-16 | Discharge: 2015-10-16 | Disposition: A | Discharge status: Home / Self Care | Payer: BLUE CROSS/BLUE SHIELD | Source: Ambulatory Visit | Attending: Internal Medicine | Admitting: Internal Medicine

## 2015-10-16 DIAGNOSIS — Z720 Tobacco use: Secondary | ICD-10-CM | POA: Diagnosis not present

## 2015-10-16 DIAGNOSIS — R0789 Other chest pain: Secondary | ICD-10-CM | POA: Diagnosis not present

## 2015-10-16 DIAGNOSIS — E663 Overweight: Secondary | ICD-10-CM | POA: Insufficient documentation

## 2015-10-16 DIAGNOSIS — R0609 Other forms of dyspnea: Secondary | ICD-10-CM | POA: Insufficient documentation

## 2015-10-16 DIAGNOSIS — R0602 Shortness of breath: Secondary | ICD-10-CM | POA: Diagnosis not present

## 2015-10-16 DIAGNOSIS — Z6830 Body mass index (BMI) 30.0-30.9, adult: Secondary | ICD-10-CM | POA: Insufficient documentation

## 2015-10-16 DIAGNOSIS — J449 Chronic obstructive pulmonary disease, unspecified: Secondary | ICD-10-CM | POA: Diagnosis not present

## 2015-10-16 LAB — MYOCARDIAL PERFUSION IMAGING
Estimated workload: 12.8 METS
Exercise duration (min): 10 min
Exercise duration (sec): 41 s
LV dias vol: 111 mL (ref 62–150)
LV sys vol: 56 mL
MPHR: 93 {beats}/min
Peak HR: 153 {beats}/min
Percent HR: 163 %
RPE: 17
Rest HR: 56 {beats}/min
SDS: 1
SRS: 0
SSS: 1
TID: 0.85

## 2015-10-16 MED ORDER — TECHNETIUM TC 99M TETROFOSMIN IV KIT
30.8000 | PACK | Freq: Once | INTRAVENOUS | Status: DC | PRN
  Filled 2015-10-16: qty 31

## 2015-10-16 MED ORDER — REGADENOSON 0.4 MG/5ML IV SOLN: 0.4000 mg | Freq: Once | INTRAVENOUS | Status: DC

## 2015-10-16 MED ORDER — TECHNETIUM TC 99M TETROFOSMIN IV KIT
10.9000 | PACK | Freq: Once | INTRAVENOUS | Status: DC | PRN
  Filled 2015-10-16: qty 11

## 2015-10-16 MED ORDER — TECHNETIUM TC 99M TETROFOSMIN IV KIT
9.8000 | PACK | Freq: Once | INTRAVENOUS | Status: AC | PRN
  Administered 2015-10-16: 10 via INTRAVENOUS
  Filled 2015-10-16: qty 10

## 2015-10-16 MED ORDER — TECHNETIUM TC 99M TETROFOSMIN IV KIT
30.5000 | PACK | Freq: Once | INTRAVENOUS | Status: AC | PRN
  Administered 2015-10-16: 31 via INTRAVENOUS
  Filled 2015-10-16: qty 31

## 2015-10-17 ENCOUNTER — Telehealth: Payer: Self-pay | Admitting: Cardiology

## 2015-10-17 NOTE — Telephone Encounter (Signed)
Returned call during requested time slot. Patient answered and results were discussed.  He had no further concerns or questions at this time and voiced thanks for phone call. Will follow up as scheduled.

## 2015-10-17 NOTE — Telephone Encounter (Signed)
New message       Returning a nurses call to get test results.  Please call between 11-11:30 while pt is at lunch.

## 2015-11-19 ENCOUNTER — Ambulatory Visit (INDEPENDENT_AMBULATORY_CARE_PROVIDER_SITE_OTHER): Payer: BLUE CROSS/BLUE SHIELD | Admitting: Family Medicine

## 2015-11-19 ENCOUNTER — Telehealth: Payer: Self-pay | Admitting: Family Medicine

## 2015-11-19 ENCOUNTER — Encounter: Payer: Self-pay | Admitting: Family Medicine

## 2015-11-19 VITALS — BP 112/77 | HR 60 | Temp 98.1°F | Resp 20 | Ht 66.0 in | Wt 192.5 lb

## 2015-11-19 DIAGNOSIS — H6121 Impacted cerumen, right ear: Secondary | ICD-10-CM

## 2015-11-19 DIAGNOSIS — Z6831 Body mass index (BMI) 31.0-31.9, adult: Secondary | ICD-10-CM

## 2015-11-19 DIAGNOSIS — Z23 Encounter for immunization: Secondary | ICD-10-CM

## 2015-11-19 DIAGNOSIS — Z0000 Encounter for general adult medical examination without abnormal findings: Secondary | ICD-10-CM

## 2015-11-19 LAB — LIPID PANEL
Cholesterol: 127 mg/dL (ref 0–200)
HDL: 44.2 mg/dL (ref >39.00)
LDL Cholesterol: 71 mg/dL (ref 0–99)
NonHDL: 82.52
Total CHOL/HDL Ratio: 3
Triglycerides: 58 mg/dL (ref 0.0–149.0)
VLDL: 11.6 mg/dL (ref 0.0–40.0)

## 2015-11-19 LAB — HEMOGLOBIN A1C: Hgb A1c MFr Bld: 5.5 % (ref 4.6–6.5)

## 2015-11-19 MED ORDER — CARBAMIDE PEROXIDE 6.5 % OT SOLN: 5.0000 [drp] | Freq: Two times a day (BID) | OTIC | 0 refills | Status: DC

## 2015-11-19 NOTE — Telephone Encounter (Signed)
Please call pt: - his labs are normal.  

## 2015-11-19 NOTE — Patient Instructions (Signed)

## 2015-11-19 NOTE — Progress Notes (Signed)
Patient ID: Matthew Sims, male  DOB: [birth year], 55 y.o.   MRN: [id number] Patient Care Team    Relationship Specialty Notifications Start End  Matthew Sims PCP - General Family Medicine  06/01/15   Matthew Sims Attending Physician Gastroenterology  11/19/15   Matthew Sims Consulting Physician Cardiology  11/19/15     Subjective:  Matthew Sims is a 55 y.o. male present for CPE. All past medical history, surgical history, allergies, family history, immunizations, medications and social history were updated in the electronic medical record today. All recent labs, ED visits and hospitalizations within the last year were reviewed.  Health maintenance:  Colonoscopy: last screen 06/17/2005, recommend follow up 3 years, polyps present.  Completed by Dr. Ardis Hughs. Immunizations:  tdap 2007, administered today. Influenza declined.  Infectious disease screening: HIV and Hep C completed.  PSA: Pt was counseled on prostate cancer screenings. Declines PSA today.  Lab Results  Component Value Date   PSA 1.0 01/26/2013   Assistive device: None Oxygen [id number]  Patient has a Dental home. Hospitalizations/ED visits: none   There is no immunization history on file for this patient.   Past Medical History:  Diagnosis Date  . Chicken pox   . Colon polyps   . COPD (chronic obstructive pulmonary disease) (HCC)    no inhaler   Allergies  Allergen Reactions  . Bee Venom Swelling    Swelling around the area / hands / lips Difficulty in swallowing   Past Surgical History:  Procedure Laterality Date  . HERNIA REPAIR  [id number]   umbilical hernia  . ROTATOR CUFF REPAIR Right 2000, 2010  . rotator cuff tear Left 2001  . SHOULDER SURGERY    . TONSILLECTOMY  1966   Family History  Problem Relation Age of Onset  . Breast cancer Mother   . COPD Mother   . Alcohol abuse Maternal Grandfather   . Colon cancer Neg Hx   . Colon polyps Neg Hx   . Esophageal cancer Neg Hx   .  Rectal cancer Neg Hx   . Stomach cancer Neg Hx    Social History   Social History  . Marital status: Single    Spouse name: N/A  . Number of children: N/A  . Years of education: N/A   Occupational History  . Not on file.   Social History Main Topics  . Smoking status: Current Every Day Smoker    Packs/day: 1.00    Years: 43.00    Types: Cigarettes  . Smokeless tobacco: Never Used  . 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 Drug use: No  . Sexual activity: Not on file   Other Topics Concern  . Not on file   Social History Narrative   Single. Employed. High school education.    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   Smokes daily.    Wears seat belt.    Exercises  3 times a week.    Smoke alarm in the home.    Guns in the home.      Medication List       Accurate as of 11/19/15  9:00 AM. Always use your most recent med list.          carbamide peroxide 6.5 % otic solution Commonly known as:  DEBROX Place 5 drops into the right ear 2 (two) times daily.   EPINEPHrine 0.3 mg/0.3  mL Soaj injection Commonly known as:  EPI-PEN Inject 0.3 mLs (0.3 mg total) into the muscle once.        Recent Results (from the past 2160 hour(s))  TSH     Status: None   Collection Time: 08/28/15  9:01 AM  Result Value Ref Range   TSH 1.74 0.35 - 4.50 uIU/mL  CBC w/Diff     Status: None   Collection Time: 08/28/15  9:01 AM  Result Value Ref Range   WBC 6.3 4.0 - 10.5 K/uL   RBC 5.19 4.22 - 5.81 Mil/uL   Hemoglobin 15.7 13.0 - 17.0 g/dL   HCT 46.6 39.0 - 52.0 %   MCV 89.7 78.0 - 100.0 fl   MCHC 33.8 30.0 - 36.0 g/dL   RDW 14.4 11.5 - 15.5 %   Platelets 254.0 150.0 - 400.0 K/uL   Neutrophils Relative % 70.1 43.0 - 77.0 %   Lymphocytes Relative 21.1 12.0 - 46.0 %   Monocytes Relative 6.6 3.0 - 12.0 %   Eosinophils Relative 2.0 0.0 - 5.0 %   Basophils Relative 0.2 0.0 - 3.0 %   Neutro Abs 4.4 1.4 - 7.7 K/uL   Lymphs Abs 1.3 0.7 - 4.0 K/uL    Monocytes Absolute 0.4 0.1 - 1.0 K/uL   Eosinophils Absolute 0.1 0.0 - 0.7 K/uL   Basophils Absolute 0.0 0.0 - 0.1 K/uL  Comprehensive metabolic panel     Status: None   Collection Time: 08/28/15  9:01 AM  Result Value Ref Range   Sodium 139 135 - 145 mEq/L   Potassium 4.2 3.5 - 5.1 mEq/L   Chloride 105 96 - 112 mEq/L   CO2 29 19 - 32 mEq/L   Glucose, Bld 93 70 - 99 mg/dL   BUN 10 6 - 23 mg/dL   Creatinine, Ser 0.84 0.40 - 1.50 mg/dL   Total Bilirubin 0.6 0.2 - 1.2 mg/dL   Alkaline Phosphatase 64 39 - 117 U/L   AST 17 0 - 37 U/L   ALT 15 0 - 53 U/L   Total Protein 6.2 6.0 - 8.3 g/dL   Albumin 3.8 3.5 - 5.2 g/dL   Calcium 9.1 8.4 - 10.5 mg/dL   GFR 100.13 >60.00 mL/min  Magnesium     Status: None   Collection Time: 08/28/15  9:01 AM  Result Value Ref Range   Magnesium 2.0 1.5 - 2.5 mg/dL  Myocardial Perfusion Imaging     Status: None   Collection Time: 10/16/15  9:38 AM  Result Value Ref Range   Rest HR 56 bpm   Rest BP 130/94 mmHg   RPE 17    Exercise duration (sec) 41 sec   Percent HR 163 %   Exercise duration (min) 10 min   Estimated workload 12.8 METS   Peak HR 153 bpm   Peak BP 141/75 mmHg   MPHR 93 bpm   SSS 1    SRS 0    SDS 1    TID 0.85    LV sys vol 56 mL   LV dias vol 111 62 - 150 mL    No results found.   ROS: 14 pt review of systems performed and negative (unless mentioned in an HPI)  Objective: BP 112/77 (BP Location: Right Arm, Patient Position: Sitting, Cuff Size: Large)   Pulse 60   Temp 98.1 F (36.7 C)   Resp 20   Ht 5\' 6"  (1.676 m)   Wt 192 lb 8 oz (87.3  kg)   SpO2 95%   BMI 31.07 kg/m  Gen: Afebrile. No acute distress. Nontoxic in appearance, well-developed, well-nourished,  Caucasian male, obese HENT: AT. [place]. Bilateral TM visualized and normal in appearance, normal external auditory canal. MMM, no oral lesions, adequate dentition. Bilateral nares within normal limits. Throat without erythema, ulcerations or exudates. no Cough on  exam, no hoarseness on exam. Eyes:Pupils Equal Round Reactive to light, Extraocular movements intact,  Conjunctiva without redness, discharge or icterus. Neck/lymp/endocrine: Supple,no lymphadenopathy, no thyromegaly CV: RRR no murmur, no edema, +2/4 P posterior tibialis pulses.  Chest: CTAB, no wheeze, rhonchi or crackles. normal Respiratory effort. good Air movement. Abd: Soft. obese. NTND. BS present. no Masses palpated. No hepatosplenomegaly. No rebound tenderness or guarding. Skin: no rashes, purpura or petechiae. Warm and well-perfused. Skin intact. Neuro/Msk:  Normal gait. PERLA. EOMi. Alert. Oriented x3.  Cranial nerves II through XII intact. Muscle strength 5/5 upper/lower extremity. DTRs equal bilaterally. Psych: Normal affect, dress and demeanor. Normal speech. Normal thought content and judgment.   Assessment/plan: Ren Gladys is a 55 y.o. male present for CPE Encounter for preventive health examination Patient was encouraged to exercise greater than 150 minutes a week. Patient was encouraged to choose a diet filled with fresh fruits and vegetables, and lean meats. AVS provided to patient today for education/recommendation on gender specific health and safety maintenance. Colonoscopy: last screen 06/17/2005, recommend follow up 3 years, polyps present.Completed by Dr. Ardis Hughs. Immunizations:  tdap 2007, administered today. Influenza declined.  Infectious disease screening: HIV and Hep C completed.  PSA: Pt was counseled on prostate cancer screenings. Declines PSA today.   BMI 31.0-31.9,adult - diet and exercise counseled.  - Lipid panel - HgB A1c  impacted cerumen of right ear - carbamide peroxide (DEBROX) 6.5 % otic solution; Place 5 drops into the right ear 2 (two) times daily.  Dispense: 15 mL; Refill: 0   Return in about 1 year (around 11/18/2016) for CPE.  Electronically signed by: Howard Pouch, Sims [place]

## 2015-11-20 NOTE — Telephone Encounter (Signed)
Left detailed message of normal lab results.

## 2015-12-04 NOTE — Progress Notes (Signed)
Cardiology Office Note   Date:  12/05/2015   ID:  Matthew Sims, DOB 01-17-1961, MRN [id number]  PCP:  Howard Pouch, DO  Cardiologist:   Minus Breeding, MD   Chief Complaint  Patient presents with  . Chest Pain      History of Present Illness: Matthew Sims is a 55 y.o. male who presents for evaluation of chest pain that started about 3 or 4 months ago.  In Sept I did send him for a perfusion study which demonstrated no evidence of ischemia.  However, I brought him back to discuss the symptoms today.  He says that since he stopped drinking [place] per day that he is doing better.  He has no longer had palpitations. He's not describing chest pressure, neck or arm discomfort.  He's able to do his vigorous job without bringing on previous symptoms.  Past Medical History:  Diagnosis Date  . Chicken pox   . Colon polyps   . COPD (chronic obstructive pulmonary disease) (HCC)    no inhaler    Past Surgical History:  Procedure Laterality Date  . HERNIA REPAIR  [id number]   umbilical hernia  . ROTATOR CUFF REPAIR Right 2000, 2010  . rotator cuff tear Left 2001  . SHOULDER SURGERY    . TONSILLECTOMY  1966     Current Outpatient Prescriptions  Medication Sig Dispense Refill  . EPINEPHrine 0.3 mg/0.3 mL IJ SOAJ injection Inject 0.3 mLs (0.3 mg total) into the muscle once. 1 Device 0  . varenicline (CHANTIX STARTING MONTH PAK) 0.5 MG X 11 & 1 MG X 42 tablet .5 mg tablet daily for 3 days, then 0.5 mg tablet twice daily for 4 days, then increase to one 1 mg tablet twice daily 53 tablet 0  . varenicline (CHANTIX) 1 MG tablet Take 1 tablet (1 mg total) by mouth 2 (two) times daily. 60 tablet 3   No current facility-administered medications for this visit.     Allergies:   Bee venom    ROS:  Please see the history of present illness.   Otherwise, review of systems are positive for none.   All other systems are reviewed and negative.    PHYSICAL EXAM: VS:  BP 102/80   Pulse  80   Ht 5\' 6"  (1.676 m)   Wt 193 lb (87.5 kg)   BMI 31.15 kg/m  , BMI Body mass index is 31.15 kg/m. GENERAL:  Well appearing HEENT:  Pupils equal round and reactive, fundi not visualized, oral mucosa unremarkable, poor dentition.  NECK:  No jugular venous distention, waveform within normal limits, carotid upstroke brisk and symmetric, no bruits, no thyromegaly LUNGS:  Clear to auscultation bilaterally BACK:  No CVA tenderness CHEST:  Unremarkable HEART:  PMI not displaced or sustained,S1 and S2 within normal limits, no S3, no S4, no clicks, no rubs, no murmurs ABD:  Flat, positive bowel sounds normal in frequency in pitch, no bruits, no rebound, no guarding, no midline pulsatile mass, no hepatomegaly, no splenomegaly EXT:  2 plus pulses throughout, no edema, no cyanosis no clubbing   EKG:  EKG is not ordered today.   Recent Labs: 08/28/2015: ALT 15; BUN 10; Creatinine, Ser 0.84; Hemoglobin 15.7; Magnesium 2.0; Platelets 254.0; Potassium 4.2; Sodium 139; TSH 1.74    Lipid Panel    Component Value Date/Time   CHOL 127 11/19/2015 0901   CHOL 151 01/26/2013 1432   TRIG 58.0 11/19/2015 0901   HDL 44.20 11/19/2015 0901  HDL 48 01/26/2013 1432   CHOLHDL 3 11/19/2015 0901   VLDL 11.6 11/19/2015 0901   LDLCALC 71 11/19/2015 0901   LDLCALC 79 01/26/2013 1432      Wt Readings from Last 3 Encounters:  12/05/15 193 lb (87.5 kg)  11/19/15 192 lb 8 oz (87.3 kg)  10/16/15 190 lb (86.2 kg)      Other studies Reviewed: Additional studies/ records that were reviewed today include: None Review of the above records demonstrates:   ASSESSMENT AND PLAN:  CHEST PAIN:   His symptoms have improved and he had a negative perfusion study.  No change in therapy is indicated.   TOBACCO ABUSE:   He has a remarkable smoking history.  He does want to try Chantix.  We discussed all of the potential side effects and in particular I reviewed the [employer].  He has no depression.  He  understands and wishes to try this medication.  Current medicines are reviewed at length with the patient today.  The patient does not have concerns regarding medicines.  The following changes have been made: Chantix  Labs/ tests ordered today include:  None  No orders of the defined types were placed in this encounter.    Disposition:   FU with me in as needed.      Signed, Minus Breeding, MD  12/05/2015 1:55 PM    [place]

## 2015-12-05 ENCOUNTER — Encounter: Payer: Self-pay | Admitting: Cardiology

## 2015-12-05 ENCOUNTER — Ambulatory Visit (INDEPENDENT_AMBULATORY_CARE_PROVIDER_SITE_OTHER): Payer: BLUE CROSS/BLUE SHIELD | Admitting: Cardiology

## 2015-12-05 VITALS — BP 102/80 | HR 80 | Ht 66.0 in | Wt 193.0 lb

## 2015-12-05 DIAGNOSIS — Z720 Tobacco use: Secondary | ICD-10-CM | POA: Diagnosis not present

## 2015-12-05 DIAGNOSIS — R0789 Other chest pain: Secondary | ICD-10-CM | POA: Diagnosis not present

## 2015-12-05 MED ORDER — VARENICLINE TARTRATE 0.5 MG X 11 & 1 MG X 42 PO MISC: ORAL | 0 refills | Status: DC

## 2015-12-05 MED ORDER — VARENICLINE TARTRATE 1 MG PO TABS: 1.0000 mg | ORAL_TABLET | Freq: Two times a day (BID) | ORAL | 3 refills | Status: DC

## 2015-12-05 NOTE — Patient Instructions (Signed)
Medication Instructions:  Please start Chantix as directed. The current medical regimen is effective;  continue present plan and medications.  Follow-Up: Follow up as needed with Dr Percival Spanish.  Thank you for choosing [facility]!!

## 2016-03-07 ENCOUNTER — Emergency Department (HOSPITAL_COMMUNITY): Payer: BLUE CROSS/BLUE SHIELD

## 2016-03-07 ENCOUNTER — Emergency Department (HOSPITAL_COMMUNITY)
Admission: EM | Admit: 2016-03-07 | Discharge: 2016-03-07 | Disposition: A | Discharge status: Home / Self Care | Payer: BLUE CROSS/BLUE SHIELD | Attending: Emergency Medicine | Admitting: Emergency Medicine

## 2016-03-07 ENCOUNTER — Encounter (HOSPITAL_COMMUNITY): Payer: Self-pay | Admitting: *Deleted

## 2016-03-07 DIAGNOSIS — R1031 Right lower quadrant pain: Secondary | ICD-10-CM | POA: Diagnosis present

## 2016-03-07 DIAGNOSIS — K4090 Unilateral inguinal hernia, without obstruction or gangrene, not specified as recurrent: Secondary | ICD-10-CM | POA: Insufficient documentation

## 2016-03-07 DIAGNOSIS — Z79899 Other long term (current) drug therapy: Secondary | ICD-10-CM | POA: Insufficient documentation

## 2016-03-07 DIAGNOSIS — F17210 Nicotine dependence, cigarettes, uncomplicated: Secondary | ICD-10-CM | POA: Diagnosis not present

## 2016-03-07 DIAGNOSIS — J449 Chronic obstructive pulmonary disease, unspecified: Secondary | ICD-10-CM | POA: Insufficient documentation

## 2016-03-07 LAB — I-STAT CHEM 8, ED
BUN: 16 mg/dL (ref 6–20)
Calcium, Ion: 1.21 mmol/L (ref 1.15–1.40)
Chloride: 102 mmol/L (ref 101–111)
Creatinine, Ser: 1 mg/dL (ref 0.61–1.24)
Glucose, Bld: 92 mg/dL (ref 65–99)
HCT: 45 % (ref 39.0–52.0)
Hemoglobin: 15.3 g/dL (ref 13.0–17.0)
Potassium: 4 mmol/L (ref 3.5–5.1)
Sodium: 139 mmol/L (ref 135–145)
TCO2: 26 mmol/L (ref 0–100)

## 2016-03-07 LAB — URINALYSIS, ROUTINE W REFLEX MICROSCOPIC
Bilirubin Urine: NEGATIVE
Glucose, UA: NEGATIVE mg/dL
Hgb urine dipstick: NEGATIVE
Ketones, ur: NEGATIVE mg/dL
Leukocytes, UA: NEGATIVE
Nitrite: NEGATIVE
Protein, ur: NEGATIVE mg/dL
Specific Gravity, Urine: 1.023 (ref 1.005–1.030)
pH: 6 (ref 5.0–8.0)

## 2016-03-07 MED ORDER — OXYCODONE-ACETAMINOPHEN 5-325 MG PO TABS
1.0000 | ORAL_TABLET | Freq: Once | ORAL | Status: AC
  Administered 2016-03-07: 1 via ORAL
  Filled 2016-03-07: qty 1

## 2016-03-07 MED ORDER — KETOROLAC TROMETHAMINE 60 MG/2ML IM SOLN
60.0000 mg | Freq: Once | INTRAMUSCULAR | Status: AC
  Administered 2016-03-07: 60 mg via INTRAMUSCULAR
  Filled 2016-03-07: qty 2

## 2016-03-07 MED ORDER — IOPAMIDOL (ISOVUE-300) INJECTION 61%
100.0000 mL | Freq: Once | INTRAVENOUS | Status: AC | PRN
  Administered 2016-03-07: 100 mL via INTRAVENOUS

## 2016-03-07 MED ORDER — NAPROXEN 500 MG PO TABS
500.0000 mg | ORAL_TABLET | Freq: Two times a day (BID) | ORAL | 0 refills | Status: DC
Start: 2016-03-07 — End: 2016-03-21

## 2016-03-07 MED ORDER — IOPAMIDOL (ISOVUE-300) INJECTION 61%
30.0000 mL | Freq: Once | INTRAVENOUS | Status: AC | PRN
  Administered 2016-03-07: 30 mL via ORAL

## 2016-03-07 MED ORDER — OXYCODONE-ACETAMINOPHEN 5-325 MG PO TABS
1.0000 | ORAL_TABLET | Freq: Four times a day (QID) | ORAL | 0 refills | Status: DC | PRN
Start: 2016-03-07 — End: 2016-03-21

## 2016-03-07 MED ORDER — IOPAMIDOL (ISOVUE-300) INJECTION 61%
INTRAVENOUS | Status: AC
  Filled 2016-03-07: qty 60

## 2016-03-07 NOTE — ED Provider Notes (Signed)
[facility] DEPT Provider Note   CSN: [id number] Arrival date & time: 03/07/16  1716     History   Chief Complaint Chief Complaint  Patient presents with  . Groin Injury    HPI Rawleigh Harpel is a 56 y.o. male.  HPI   Kavarion Kleinke is a 56 y.o. male, with a history of COPD, presenting to the ED with Right groin pain following a hard cough 2 weeks ago. States he coughed and felt a pop in his groin. Pain is been increasing in frequency and intensity since this time. Exacerbated by coughing, walking, sitting, and palpation. Patient also endorses numbness to the right inguinal region extending to the right base of the penis. Has tried Aleve without improvement. No difficulty with urination or bowel movements. Last bowel movement today. No changes in sexual function, bowel or bladder function, testicular pain or swelling, urinary complaints, abdominal pain, fever/chills, or any other complaints.     Past Medical History:  Diagnosis Date  . Chicken pox   . Colon polyps   . COPD (chronic obstructive pulmonary disease) (HCC)    no inhaler    Patient Active Problem List   Diagnosis Date Noted  . Chest pain 08/28/2015  . H/O umbilical hernia repair 123XX123  . Rash 01/03/2015  . Obesity 11/16/2014  . Encounter for preventive measure 11/16/2014  . History of alcohol abuse 11/16/2014  . Tobacco abuse 11/16/2014  . Colon cancer screening 11/16/2014  . Severe allergic reaction 11/16/2014  . Healthcare maintenance 11/16/2014    Past Surgical History:  Procedure Laterality Date  . HERNIA REPAIR  [id number]   umbilical hernia  . ROTATOR CUFF REPAIR Right 2000, 2010  . rotator cuff tear Left 2001  . SHOULDER SURGERY    . TONSILLECTOMY  1966       Home Medications    Prior to Admission medications   Medication Sig Start Date End Date Taking? Authorizing Provider  EPINEPHrine 0.3 mg/0.3 mL IJ SOAJ injection Inject 0.3 mLs (0.3 mg total) into the muscle once. 08/28/15   Renee  A Kuneff, DO  naproxen (NAPROSYN) 500 MG tablet Take 1 tablet (500 mg total) by mouth 2 (two) times daily. 03/07/16   Zakariyah Freimark C Anjoli Diemer, PA-C  oxyCODONE-acetaminophen (PERCOCET/ROXICET) 5-325 MG tablet Take 1-2 tablets by mouth every 6 (six) hours as needed for severe pain. 03/07/16   Sotero Brinkmeyer C Rosell Khouri, PA-C  varenicline (CHANTIX STARTING MONTH PAK) 0.5 MG X 11 & 1 MG X 42 tablet .5 mg tablet daily for 3 days, then 0.5 mg tablet twice daily for 4 days, then increase to one 1 mg tablet twice daily 12/05/15   Minus Breeding, MD  varenicline (CHANTIX) 1 MG tablet Take 1 tablet (1 mg total) by mouth 2 (two) times daily. 12/05/15   Minus Breeding, MD    Family History Family History  Problem Relation Age of Onset  . Breast cancer Mother   . COPD Mother   . Alcohol abuse Maternal Grandfather   . Colon cancer Neg Hx   . Colon polyps Neg Hx   . Esophageal cancer Neg Hx   . Rectal cancer Neg Hx   . Stomach cancer Neg Hx     Social History Social History  Substance Use Topics  . Smoking status: Current Every Day Smoker    Packs/day: 1.00    Years: 43.00    Types: Cigarettes  . Smokeless tobacco: Never Used  . Alcohol use No     Comment: sober since 2012  Allergies   Bee venom   Review of Systems Review of Systems  Constitutional: Negative for chills and fever.  Gastrointestinal: Negative for abdominal pain, blood in stool, nausea and vomiting.  Genitourinary: Negative for decreased urine volume, difficulty urinating, discharge, dysuria, hematuria, penile pain and scrotal swelling.  Musculoskeletal: Negative for back pain.  Neurological: Positive for numbness (to the right inginal region). Negative for weakness.  All other systems reviewed and are negative.    Physical Exam Updated Vital Signs BP 131/87 (BP Location: Left Arm)   Pulse 65   Temp 98.5 F (36.9 C) (Temporal)   Resp 20   Ht 5\' 6"  (1.676 m)   Wt 85.3 kg   SpO2 100%   BMI 30.34 kg/m   Physical Exam  Constitutional:  He appears well-developed and well-nourished. No distress.  HENT:  Head: Normocephalic and atraumatic.  Eyes: Conjunctivae are normal.  Neck: Neck supple.  Cardiovascular: Normal rate, regular rhythm, normal heart sounds and intact distal pulses.   Pulmonary/Chest: Effort normal and breath sounds normal. No respiratory distress.  Abdominal: Soft. There is no tenderness. There is no guarding. A hernia is present. Hernia confirmed positive in the right inguinal area.  Genitourinary:  Genitourinary Comments: Tenderness to the right inguinal region. Sensitivity and decreased firmness consistent with hernia defect noted to the right inguinal region. Decreased sensation voiced by patient in a small region medial to the right inguinal canal to the base of the penis. Pain elicited with palpation into the right inguinal canal. Large hernia appreciated and significant increase in pain with cough. Hernia easily reduces, but also easily recurs.  Penis, scrotum, and testicles without swelling, lesions, or tenderness. No penile discharge. Cremasteric reflex intact. Otherwise normal male genitalia. RN/Med Tech, [place], served as chaperone during the exam.  Musculoskeletal: He exhibits no edema.  Lymphadenopathy:    He has no cervical adenopathy.       Right: No inguinal adenopathy present.       Left: No inguinal adenopathy present.  Neurological: He is alert.  Skin: Skin is warm and dry. He is not diaphoretic.  Psychiatric: He has a normal mood and affect. His behavior is normal.  Nursing note and vitals reviewed.    ED Treatments / Results  Labs (all labs ordered are listed, but only abnormal results are displayed) Labs Reviewed  URINALYSIS, ROUTINE W REFLEX MICROSCOPIC  I-STAT CHEM 8, ED    EKG  EKG Interpretation None       Radiology Ct Abdomen Pelvis W Contrast  Result Date: 03/07/2016 CLINICAL DATA:  Felt pop in groin after cough, with palpable lump, and associated pain and  numbness. Initial encounter. EXAM: CT ABDOMEN AND PELVIS WITH CONTRAST TECHNIQUE: Multidetector CT imaging of the abdomen and pelvis was performed using the standard protocol following bolus administration of intravenous contrast. CONTRAST:  114mL ISOVUE-300 IOPAMIDOL (ISOVUE-300) INJECTION 61% COMPARISON:  CT of the abdomen and pelvis from 09/05/2012 FINDINGS: Lower chest: The visualized lung bases are grossly clear. The visualized portions of the mediastinum are unremarkable. Hepatobiliary: The liver is unremarkable in appearance. The gallbladder is unremarkable in appearance. The common bile duct remains normal in caliber. Pancreas: The pancreas is within normal limits. Spleen: A 1.7 cm hypodensity at the posterior aspect of the spleen is essentially stable from 2014 and likely benign. The spleen is otherwise unremarkable. Adrenals/Urinary Tract: The stomach is unremarkable in appearance. The small bowel is within normal limits. The appendix is normal in caliber, without evidence of appendicitis. The colon  is unremarkable in appearance. Stomach/Bowel: The stomach is unremarkable in appearance. The small bowel is within normal limits. The appendix is normal in caliber, without evidence of appendicitis. The colon is unremarkable in appearance. Vascular/Lymphatic: The abdominal aorta is unremarkable in appearance. The inferior vena cava is grossly unremarkable. No retroperitoneal lymphadenopathy is seen. No pelvic sidewall lymphadenopathy is identified. Reproductive: The bladder is mildly distended and grossly unremarkable. The prostate remains normal in size. Other: A small right inguinal hernia is noted, containing only fat. This is new from 2014. Musculoskeletal: No acute osseous abnormalities are identified. The visualized musculature is unremarkable in appearance. IMPRESSION: 1. Small right inguinal hernia, containing only fat, new from 2014. This may correspond to the patient's symptoms. 2. Stable 1.7 cm  hypodensity at the posterior aspect of the spleen is likely benign. Electronically Signed   By: Garald Balding M.D.   On: 03/07/2016 20:37    Procedures Procedures (including critical care time)  Medications Ordered in ED Medications  iopamidol (ISOVUE-300) 61 % injection (  Canceled Entry 03/07/16 1915)  oxyCODONE-acetaminophen (PERCOCET/ROXICET) 5-325 MG per tablet 1 tablet (1 tablet Oral Given 03/07/16 1827)  ketorolac (TORADOL) injection 60 mg (60 mg Intramuscular Given 03/07/16 1827)  iopamidol (ISOVUE-300) 61 % injection 30 mL (30 mLs Oral Contrast Given 03/07/16 1957)  iopamidol (ISOVUE-300) 61 % injection 100 mL (100 mLs Intravenous Contrast Given 03/07/16 1957)     Initial Impression / Assessment and Plan / ED Course  I have reviewed the triage vital signs and the nursing notes.  Pertinent labs & imaging results that were available during my care of the patient were reviewed by me and considered in my medical decision making (see chart for details).     Patient presents with right groin pain for the last 2 weeks. Right inguinal hernia noted on exam, but reduces easily. [place] is unavailable currently at this facility. CT of abdomen and pelvis due to the size of the defect and patient's other symptoms.  CT confirmed hernia. General surgery follow-up. Patient is appropriate for office follow-up. Spoke with Dr. Arnoldo Morale, general surgeon on-call, who agrees with the plan of care and appreciates the notification. This information was communicated with the patient. Return precautions discussed.    Findings and plan of care discussed with Milton Ferguson, MD.   Vitals:   03/07/16 1721 03/07/16 1722  BP: 131/87   Pulse: 65   Resp: 20   Temp: 98.5 F (36.9 C)   TempSrc: Temporal   SpO2: 100%   Weight:  85.3 kg  Height:  5\' 6"  (1.676 m)     Final Clinical Impressions(s) / ED Diagnoses   Final diagnoses:  Unilateral inguinal hernia without obstruction or gangrene, recurrence not  specified    New Prescriptions Discharge Medication List as of 03/07/2016  9:22 PM    START taking these medications   Details  naproxen (NAPROSYN) 500 MG tablet Take 1 tablet (500 mg total) by mouth 2 (two) times daily., Starting Fri 03/07/2016, Print    oxyCODONE-acetaminophen (PERCOCET/ROXICET) 5-325 MG tablet Take 1-2 tablets by mouth every 6 (six) hours as needed for severe pain., Starting Fri 03/07/2016, Print         Lorayne Bender, PA-C 03/08/16 0028    Milton Ferguson, MD 03/08/16 2258

## 2016-03-07 NOTE — ED Triage Notes (Signed)
Pt states he coughed and he felt a pop in his groin. Since then he felt a large bump under his skin. Has pain and numbness in this area. NAD noted.

## 2016-03-07 NOTE — Discharge Instructions (Signed)
There was evidence of a hernia on exam that was also seen on the CT scan. Please follow up with the general surgeon. Call the number provided to set up an appointment. No heavy lifting or strenuous activity until you see the surgeon. Ibuprofen or naproxen for pain. Percocet for severe pain. Do not take the Percocet while driving or performing other dangerous activities. Return to the ED should there be an onset of pain that does not subside, you are unable to have a bowel movement, or you have any other major concerns.

## 2016-03-10 ENCOUNTER — Emergency Department (HOSPITAL_COMMUNITY)
Admission: EM | Admit: 2016-03-10 | Discharge: 2016-03-10 | Disposition: A | Discharge status: Home / Self Care | Payer: BLUE CROSS/BLUE SHIELD | Attending: Emergency Medicine | Admitting: Emergency Medicine

## 2016-03-10 ENCOUNTER — Encounter (HOSPITAL_COMMUNITY): Payer: Self-pay

## 2016-03-10 DIAGNOSIS — R350 Frequency of micturition: Secondary | ICD-10-CM | POA: Diagnosis present

## 2016-03-10 DIAGNOSIS — Z79899 Other long term (current) drug therapy: Secondary | ICD-10-CM | POA: Diagnosis not present

## 2016-03-10 DIAGNOSIS — J449 Chronic obstructive pulmonary disease, unspecified: Secondary | ICD-10-CM | POA: Diagnosis not present

## 2016-03-10 DIAGNOSIS — K4091 Unilateral inguinal hernia, without obstruction or gangrene, recurrent: Secondary | ICD-10-CM | POA: Diagnosis not present

## 2016-03-10 DIAGNOSIS — F17210 Nicotine dependence, cigarettes, uncomplicated: Secondary | ICD-10-CM | POA: Insufficient documentation

## 2016-03-10 NOTE — ED Triage Notes (Signed)
I was diagnosed with an inguinal hernia on March 07, 2016.  I have called the surgeon and they did not return my call.  Today I am having trouble walking and increased urination.

## 2016-03-10 NOTE — ED Notes (Signed)
Pt states pain is worse after walking

## 2016-03-10 NOTE — ED Notes (Signed)
Patient given discharge instruction, verbalized understand. Patient ambulatory out of the department.  

## 2016-03-10 NOTE — ED Provider Notes (Signed)
[facility] DEPT Provider Note   CSN: [id number] Arrival date & time: 03/10/16  2043  By signing my name below, I, Reola Mosher, attest that this documentation has been prepared under the direction and in the presence of Isla Pence, MD. Electronically Signed: Reola Mosher, ED Scribe. 03/10/16. 9:03 PM.  History   Chief Complaint Chief Complaint  Patient presents with  . Inguinal Hernia   The history is provided by the patient and medical records. No language interpreter was used.    HPI Comments: Matthew Sims is a 55 y.o. male who presents to the Emergency Department complaining of sudden onset, gradually worsening area of pain and swelling to the right inguinal area beginning approximately two weeks ago. Pt reports that this area of pain and swelling has been present following a hard cough two weeks ago. Pt notes associated urinary frequency and numbness over his area of pain secondary to his area of pain and swelling. Pt was seen in the ED for same on 03/07/16, and at that time he was dx'd with a right inguinal hernia confirmed via CT A/P. Pt was advised to f/u w/ Dr Arnoldo Morale of surgery at that time, however, has been unable to make an appointment. His pain is exacerbated with ambulation and with urine voids. His pain is mildly alleviated with laying supine. Pt reports that he ambulates and lifts heavy objects frequently for work. His last bowel movement was today. He denies changes in bowel/bladder function, nausea, vomiting, or any other associated symptoms.   Past Medical History:  Diagnosis Date  . Chicken pox   . Colon polyps   . COPD (chronic obstructive pulmonary disease) (HCC)    no inhaler   Patient Active Problem List   Diagnosis Date Noted  . Chest pain 08/28/2015  . H/O umbilical hernia repair 123XX123  . Rash 01/03/2015  . Obesity 11/16/2014  . Encounter for preventive measure 11/16/2014  . History of alcohol abuse 11/16/2014  . Tobacco abuse  11/16/2014  . Colon cancer screening 11/16/2014  . Severe allergic reaction 11/16/2014  . Healthcare maintenance 11/16/2014   Past Surgical History:  Procedure Laterality Date  . HERNIA REPAIR  [id number]   umbilical hernia  . ROTATOR CUFF REPAIR Right 2000, 2010  . rotator cuff tear Left 2001  . SHOULDER SURGERY    . TONSILLECTOMY  1966    Home Medications    Prior to Admission medications   Medication Sig Start Date End Date Taking? Authorizing Provider  EPINEPHrine 0.3 mg/0.3 mL IJ SOAJ injection Inject 0.3 mLs (0.3 mg total) into the muscle once. 08/28/15   Renee A Kuneff, DO  naproxen (NAPROSYN) 500 MG tablet Take 1 tablet (500 mg total) by mouth 2 (two) times daily. 03/07/16   Shawn C Joy, PA-C  oxyCODONE-acetaminophen (PERCOCET/ROXICET) 5-325 MG tablet Take 1-2 tablets by mouth every 6 (six) hours as needed for severe pain. 03/07/16   Shawn C Joy, PA-C  varenicline (CHANTIX STARTING MONTH PAK) 0.5 MG X 11 & 1 MG X 42 tablet .5 mg tablet daily for 3 days, then 0.5 mg tablet twice daily for 4 days, then increase to one 1 mg tablet twice daily 12/05/15   Minus Breeding, MD  varenicline (CHANTIX) 1 MG tablet Take 1 tablet (1 mg total) by mouth 2 (two) times daily. 12/05/15   Minus Breeding, MD   Family History Family History  Problem Relation Age of Onset  . Breast cancer Mother   . COPD Mother   .  Alcohol abuse Maternal Grandfather   . Colon cancer Neg Hx   . Colon polyps Neg Hx   . Esophageal cancer Neg Hx   . Rectal cancer Neg Hx   . Stomach cancer Neg Hx    Social History Social History  Substance Use Topics  . Smoking status: Current Every Day Smoker    Packs/day: 1.00    Years: 43.00    Types: Cigarettes  . Smokeless tobacco: Never Used  . Alcohol use No     Comment: sober since 2012   Allergies   Bee venom  Review of Systems Review of Systems A complete 10 system review of systems was obtained and all systems are negative except as noted in the HPI and PMH.    Physical Exam Updated Vital Signs BP 149/88 (BP Location: Left Arm)   Pulse 70   Temp 97.3 F (36.3 C) (Tympanic)   Resp 18   Ht 5\' 6"  (1.676 m)   Wt 188 lb (85.3 kg)   SpO2 98%   BMI 30.34 kg/m   Physical Exam  Constitutional: He appears well-developed and well-nourished. No distress.  HENT:  Head: Normocephalic and atraumatic.  Eyes: Conjunctivae are normal.  Neck: Normal range of motion.  Cardiovascular: Normal rate.   Pulmonary/Chest: Effort normal.  Abdominal: He exhibits no distension. A hernia is present. Hernia confirmed positive in the right inguinal area.  Genitourinary:  Genitourinary Comments: Right inguinal hernia that is easily reducible.   Musculoskeletal: Normal range of motion.  Neurological: He is alert.  Skin: No pallor.  Psychiatric: He has a normal mood and affect. His behavior is normal.  Nursing note and vitals reviewed.  ED Treatments / Results  DIAGNOSTIC STUDIES: Oxygen Saturation is 98% on RA, normal by my interpretation.   COORDINATION OF CARE: 9:03 PM-Discussed next steps with pt. Pt verbalized understanding and is agreeable with the plan.   Labs (all labs ordered are listed, but only abnormal results are displayed) Labs Reviewed - No data to display  EKG  EKG Interpretation None      Radiology No results found.  Procedures Procedures (including critical care time)  Medications Ordered in ED Medications - No data to display  Initial Impression / Assessment and Plan / ED Course  I have reviewed the triage vital signs and the nursing notes.  Pertinent labs & imaging results that were available during my care of the patient were reviewed by me and considered in my medical decision making (see chart for details).    Pt is mainly here for a work note.  He is given one, so he can try to get in contact with Dr. Arnoldo Morale.  He did not want anything additional for pain.  He has no sign of incarcerated hernia.  He is passing stool  well w/o n/v.  He is stable for d/c.  He knows to return if worse.  Final Clinical Impressions(s) / ED Diagnoses   Final diagnoses:  Unilateral recurrent inguinal hernia without obstruction or gangrene   New Prescriptions New Prescriptions   No medications on file   I personally performed the services described in this documentation, which was scribed in my presence. The recorded information has been reviewed and is accurate.     Isla Pence, MD 03/10/16 [phone]

## 2016-03-18 NOTE — H&P (Signed)
  NTS SOAP Note  Vital Signs:  Vitals as of: Q000111Q: Systolic 0000000: Diastolic 72: Heart Rate 76: Temp 98.36F (Temporal): Height 72ft 6in: Weight 195Lbs 0 Ounces: Pain Level 5: BMI 31.47   BMI : 31.47 kg/m2  Subjective: This 56 year old male presents for of a right inguinal hernia.  Referred by Dr. Dewayne Hatch, ER.  States he was coughing three weeks ago and suddenly developed a lump in the right groin.  Made worse with straining.  Was painful, currently 3/10 when it sticks out.  Seen in the ER, diagnosed with a right inguinal hernia.  Currently it sticks out during routine activity.  Resolves when lying down. No nausea, vomiting.  Is not working due to the hernia.  Review of Symptoms:  Constitutional:negative Head:negative Eyes:negative Nose/Mouth/Throat:negative Cardiovascular:negative Respiratory:negative Gastrointestinnegative Genitourinary:negative Musculoskeletal:negative Skin:negative Hematolgic/Lymphatic:negative Allergic/Immunologic:negative   Past Medical History:Reviewed  Past Medical History  Surgical History: umbilical herniorrhapy, shoulder surgery Medical Problems: none Allergies: nkda Medications: none   Social History:Reviewed  Social History  Preferred Language: English Race:  White Ethnicity: Not Hispanic / Latino Age: 56 year Marital Status:  S Alcohol: no   Smoking Status: Current every day smoker reviewed on 03/18/2016 Started Date:  Packs per week:  Functional Status reviewed on 03/18/2016 ------------------------------------------------ Bathing: Normal Cooking: Normal Dressing: Normal Driving: Normal Eating: Normal Managing Meds: Normal Oral Care: Normal Shopping: Normal Toileting: Normal Transferring: Normal Walking: Normal Cognitive Status reviewed on 03/18/2016 ------------------------------------------------ Attention: Normal Decision Making: Normal Language: Normal Memory: Normal Motor:  Normal Perception: Normal Problem Solving: Normal Visual and Spatial: Normal   Family History:Reviewed  Family Health History Family History is Unknown    Objective Information: General:Well appearing, well nourished in no distress. Skin:no rash or prominent lesions Head:Atraumatic; no masses; no abnormalities Neck:Supple without lymphadenopathy.  Heart:RRR, no murmur or gallop.  Normal S1, S2.  No S3, S4.  Lungs:CTA bilaterally, no wheezes, rhonchi, rales.  Breathing unlabored. Abdomen:Soft, NT/ND, normal bowel sounds, no HSM, no masses.  No peritoneal signs.  Reducible right inguinal hernia. [id number] Dr. Cay Schillings notes reviewed.  CT scan report reviewed. Assessment:Right inguinal hernia  Diagnoses: 550.90  K40.90 Unilateral or unspecified inguinal hernia, without mention of obstruction or gangrene (not specified as recurrent)  Procedures: [id number] - OFFICE OUTPATIENT NEW 30 MINUTES    Plan:  Scheduled for right inguinal herniorrhaphy with mesh on 03/21/16.   Patient Education:Alternative treatments to surgery were discussed with patient (and family).Risks and benefits  of procedure including bleeding, infection, mesh use, and the possibility of recurrence of the hernia were fully explained to the patient (and family) who gave informed consent. Patient/family questions were addressed.  Follow-up:Pending Surgery

## 2016-03-19 ENCOUNTER — Encounter (HOSPITAL_COMMUNITY)
Admission: RE | Admit: 2016-03-19 | Discharge: 2016-03-19 | Disposition: A | Discharge status: Home / Self Care | Payer: BLUE CROSS/BLUE SHIELD | Source: Ambulatory Visit | Attending: General Surgery | Admitting: General Surgery

## 2016-03-19 ENCOUNTER — Encounter (HOSPITAL_COMMUNITY): Payer: Self-pay

## 2016-03-21 ENCOUNTER — Encounter (HOSPITAL_COMMUNITY): Payer: Self-pay | Admitting: *Deleted

## 2016-03-21 ENCOUNTER — Ambulatory Visit (HOSPITAL_COMMUNITY): Payer: BLUE CROSS/BLUE SHIELD | Admitting: Anesthesiology

## 2016-03-21 ENCOUNTER — Encounter (HOSPITAL_COMMUNITY)
Admission: RE | Disposition: A | Discharge status: Home / Self Care | Payer: Self-pay | Source: Ambulatory Visit | Attending: General Surgery

## 2016-03-21 ENCOUNTER — Ambulatory Visit (HOSPITAL_COMMUNITY)
Admission: RE | Admit: 2016-03-21 | Discharge: 2016-03-21 | Disposition: A | Discharge status: Home / Self Care | Payer: BLUE CROSS/BLUE SHIELD | Source: Ambulatory Visit | Attending: General Surgery | Admitting: General Surgery

## 2016-03-21 DIAGNOSIS — F17200 Nicotine dependence, unspecified, uncomplicated: Secondary | ICD-10-CM | POA: Insufficient documentation

## 2016-03-21 DIAGNOSIS — K4090 Unilateral inguinal hernia, without obstruction or gangrene, not specified as recurrent: Secondary | ICD-10-CM | POA: Diagnosis present

## 2016-03-21 HISTORY — PX: INGUINAL HERNIA REPAIR: SHX194

## 2016-03-21 SURGERY — REPAIR, HERNIA, INGUINAL, ADULT
Anesthesia: General | Site: Inguinal | Laterality: Right

## 2016-03-21 MED ORDER — HYDROMORPHONE HCL 1 MG/ML IJ SOLN: 0.2500 mg | INTRAMUSCULAR | Status: DC | PRN

## 2016-03-21 MED ORDER — BUPIVACAINE HCL (PF) 0.5 % IJ SOLN
INTRAMUSCULAR | Status: AC
  Filled 2016-03-21: qty 30

## 2016-03-21 MED ORDER — FENTANYL CITRATE (PF) 250 MCG/5ML IJ SOLN
INTRAMUSCULAR | Status: AC
  Filled 2016-03-21: qty 5

## 2016-03-21 MED ORDER — KETOROLAC TROMETHAMINE 30 MG/ML IJ SOLN
INTRAMUSCULAR | Status: AC
  Filled 2016-03-21: qty 1

## 2016-03-21 MED ORDER — CHLORHEXIDINE GLUCONATE CLOTH 2 % EX PADS: 6.0000 | MEDICATED_PAD | Freq: Once | CUTANEOUS | Status: DC

## 2016-03-21 MED ORDER — LACTATED RINGERS IV SOLN
INTRAVENOUS | Status: DC
  Administered 2016-03-21: 1000 mL via INTRAVENOUS

## 2016-03-21 MED ORDER — CEFAZOLIN SODIUM-DEXTROSE 2-4 GM/100ML-% IV SOLN
2.0000 g | INTRAVENOUS | Status: AC
  Administered 2016-03-21: 2 g via INTRAVENOUS
  Filled 2016-03-21: qty 100

## 2016-03-21 MED ORDER — FENTANYL CITRATE (PF) 100 MCG/2ML IJ SOLN
INTRAMUSCULAR | Status: AC
  Filled 2016-03-21: qty 2

## 2016-03-21 MED ORDER — FENTANYL CITRATE (PF) 100 MCG/2ML IJ SOLN
INTRAMUSCULAR | Status: DC | PRN
  Administered 2016-03-21: 50 ug via INTRAVENOUS
  Administered 2016-03-21: 25 ug via INTRAVENOUS
  Administered 2016-03-21: 50 ug via INTRAVENOUS
  Administered 2016-03-21: 25 ug via INTRAVENOUS
  Administered 2016-03-21: 100 ug via INTRAVENOUS

## 2016-03-21 MED ORDER — LIDOCAINE HCL (CARDIAC) 10 MG/ML IV SOLN
INTRAVENOUS | Status: DC | PRN
  Administered 2016-03-21: 50 mg via INTRAVENOUS

## 2016-03-21 MED ORDER — ONDANSETRON HCL 4 MG/2ML IJ SOLN
4.0000 mg | Freq: Once | INTRAMUSCULAR | Status: AC
  Administered 2016-03-21: 4 mg via INTRAVENOUS

## 2016-03-21 MED ORDER — SODIUM CHLORIDE 0.9 % IR SOLN
Status: DC | PRN
  Administered 2016-03-21: 1000 mL

## 2016-03-21 MED ORDER — KETOROLAC TROMETHAMINE 30 MG/ML IJ SOLN
30.0000 mg | Freq: Once | INTRAMUSCULAR | Status: AC
Start: 2016-03-21 — End: 2016-03-21
  Administered 2016-03-21: 30 mg via INTRAVENOUS

## 2016-03-21 MED ORDER — PROPOFOL 10 MG/ML IV BOLUS
INTRAVENOUS | Status: DC | PRN
  Administered 2016-03-21: 100 mg via INTRAVENOUS
  Administered 2016-03-21: 50 mg via INTRAVENOUS
  Administered 2016-03-21: 20 mg via INTRAVENOUS
  Administered 2016-03-21: 180 mg via INTRAVENOUS

## 2016-03-21 MED ORDER — PROPOFOL 10 MG/ML IV BOLUS
INTRAVENOUS | Status: AC
  Filled 2016-03-21: qty 20

## 2016-03-21 MED ORDER — GLYCOPYRROLATE 0.2 MG/ML IJ SOLN
0.2000 mg | Freq: Once | INTRAMUSCULAR | Status: AC | PRN
  Administered 2016-03-21: 0.2 mg via INTRAVENOUS

## 2016-03-21 MED ORDER — BUPIVACAINE LIPOSOME 1.3 % IJ SUSP
INTRAMUSCULAR | Status: AC
  Filled 2016-03-21: qty 20

## 2016-03-21 MED ORDER — LIDOCAINE HCL (PF) 1 % IJ SOLN
INTRAMUSCULAR | Status: AC
  Filled 2016-03-21: qty 5

## 2016-03-21 MED ORDER — OXYCODONE-ACETAMINOPHEN 7.5-325 MG PO TABS
1.0000 | ORAL_TABLET | ORAL | 0 refills | Status: DC | PRN
Start: 2016-03-21 — End: 2016-11-17

## 2016-03-21 MED ORDER — MIDAZOLAM HCL 2 MG/2ML IJ SOLN
INTRAMUSCULAR | Status: AC
  Filled 2016-03-21: qty 2

## 2016-03-21 MED ORDER — BUPIVACAINE LIPOSOME 1.3 % IJ SUSP
INTRAMUSCULAR | Status: DC | PRN
  Administered 2016-03-21: 20 mL

## 2016-03-21 MED ORDER — MIDAZOLAM HCL 2 MG/2ML IJ SOLN
1.0000 mg | INTRAMUSCULAR | Status: AC
  Administered 2016-03-21: 2 mg via INTRAVENOUS

## 2016-03-21 MED ORDER — GLYCOPYRROLATE 0.2 MG/ML IJ SOLN
INTRAMUSCULAR | Status: AC
  Filled 2016-03-21: qty 1

## 2016-03-21 MED ORDER — ONDANSETRON HCL 4 MG/2ML IJ SOLN
INTRAMUSCULAR | Status: AC
  Filled 2016-03-21: qty 2

## 2016-03-21 SURGICAL SUPPLY — 40 items
BAG HAMPER (MISCELLANEOUS) ×2 IMPLANT
CLOTH BEACON ORANGE TIMEOUT ST (SAFETY) ×2 IMPLANT
COVER LIGHT HANDLE STERIS (MISCELLANEOUS) ×4 IMPLANT
DERMABOND ADVANCED (GAUZE/BANDAGES/DRESSINGS) ×1
DERMABOND ADVANCED .7 DNX12 (GAUZE/BANDAGES/DRESSINGS) ×1 IMPLANT
DRAIN PENROSE 18X1/2 LTX STRL (DRAIN) ×2 IMPLANT
ELECT REM PT RETURN 9FT ADLT (ELECTROSURGICAL) ×2
ELECTRODE REM PT RTRN 9FT ADLT (ELECTROSURGICAL) ×1 IMPLANT
GLOVE BIOGEL M 7.0 STRL (GLOVE) ×4 IMPLANT
GLOVE BIOGEL PI IND STRL 7.0 (GLOVE) ×3 IMPLANT
GLOVE BIOGEL PI INDICATOR 7.0 (GLOVE) ×3
GLOVE ECLIPSE 6.5 STRL STRAW (GLOVE) ×4 IMPLANT
GLOVE EXAM NITRILE MD LF STRL (GLOVE) ×2 IMPLANT
GLOVE SURG SS PI 7.5 STRL IVOR (GLOVE) ×2 IMPLANT
GOWN STRL REUS W/ TWL LRG LVL3 (GOWN DISPOSABLE) ×1 IMPLANT
GOWN STRL REUS W/ TWL XL LVL3 (GOWN DISPOSABLE) ×1 IMPLANT
GOWN STRL REUS W/TWL LRG LVL3 (GOWN DISPOSABLE) ×5 IMPLANT
GOWN STRL REUS W/TWL XL LVL3 (GOWN DISPOSABLE) ×1
INST SET MINOR GENERAL (KITS) ×2 IMPLANT
KIT ROOM TURNOVER APOR (KITS) ×2 IMPLANT
MANIFOLD NEPTUNE II (INSTRUMENTS) ×2 IMPLANT
MESH HERNIA 1.6X1.9 PLUG LRG (Mesh General) ×1 IMPLANT
MESH HERNIA PLUG LRG (Mesh General) ×1 IMPLANT
NEEDLE HYPO 21X1.5 SAFETY (NEEDLE) ×2 IMPLANT
NS IRRIG 1000ML POUR BTL (IV SOLUTION) ×2 IMPLANT
PACK MINOR (CUSTOM PROCEDURE TRAY) ×2 IMPLANT
PAD ARMBOARD 7.5X6 YLW CONV (MISCELLANEOUS) ×2 IMPLANT
SCRUB PCMX 4 OZ (MISCELLANEOUS) ×2 IMPLANT
SET BASIN LINEN APH (SET/KITS/TRAYS/PACK) ×2 IMPLANT
SUT NOVA NAB GS-22 2 2-0 T-19 (SUTURE) ×6 IMPLANT
SUT PROLENE 2 0 SH 30 (SUTURE) IMPLANT
SUT SILK 3 0 (SUTURE)
SUT SILK 3-0 18XBRD TIE 12 (SUTURE) IMPLANT
SUT VIC AB 2-0 CT1 27 (SUTURE) ×1
SUT VIC AB 2-0 CT1 TAPERPNT 27 (SUTURE) ×1 IMPLANT
SUT VIC AB 3-0 SH 27 (SUTURE) ×1
SUT VIC AB 3-0 SH 27X BRD (SUTURE) ×1 IMPLANT
SUT VIC AB 4-0 PS2 27 (SUTURE) ×2 IMPLANT
SUT VICRYL AB 3 0 TIES (SUTURE) ×2 IMPLANT
SYR 20CC LL (SYRINGE) ×2 IMPLANT

## 2016-03-21 NOTE — Anesthesia Preprocedure Evaluation (Signed)
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)  alcohol use,   Endo/Other    Renal/GU      Musculoskeletal   Abdominal   Peds  Hematology   Anesthesia Other Findings   Reproductive/Obstetrics                             Anesthesia Physical Anesthesia Plan  ASA: III  Anesthesia Plan: General   Post-op Pain Management:    Induction: Intravenous  Airway Management Planned: LMA  Additional Equipment:   Intra-op Plan:   Post-operative Plan: Extubation in OR  Informed Consent: I have reviewed the patients History and Physical, chart, labs and discussed the procedure including the risks, benefits and alternatives for the proposed anesthesia with the patient or authorized representative who has indicated his/her understanding and acceptance.     Plan Discussed with:   Anesthesia Plan Comments:         Anesthesia Quick Evaluation

## 2016-03-21 NOTE — Interval H&P Note (Signed)
History and Physical Interval Note:  03/21/2016 8:03 AM  Matthew Sims  has presented today for surgery, with the diagnosis of right inguinal hernia  The various methods of treatment have been discussed with the patient and family. After consideration of risks, benefits and other options for treatment, the patient has consented to  Procedure(s): HERNIA REPAIR INGUINAL ADULT WITH MESH (Right) as a surgical intervention .  The patient's history has been reviewed, patient examined, no change in status, stable for surgery.  I have reviewed the patient's chart and labs.  Questions were answered to the patient's satisfaction.     Aviva Signs A

## 2016-03-21 NOTE — Op Note (Signed)
Patient:  Matthew Sims  DOB:  [birth year]  MRN:  [id number]   Preop Diagnosis:  Right inguinal hernia  Postop Diagnosis:  Same  Procedure:  Right inguinal herniorrhaphy with mesh  Surgeon:  Aviva Signs, M.D.  Anes:  Gen.  Indications:  Patient is a 56 year old white male who presents with a symptomatic right inguinal hernia. The risks and benefits of the procedure including bleeding, infection, mesh use, and the possibility of recurrence of the hernia were fully explained to the patient, who gave informed consent.  Procedure note:  The patient was placed in the supine position. After general anesthesia was administered, the right groin region was prepped and draped using the usual sterile technique with CHG. Surgical site confirmation was performed.  An incision was made in the right groin region down to the external ligated and). The aponeurosis was incised to the external ring. A Penrose drain was placed around spermatic cord. The vas deferens was no within the spermatic cord. The ilioinguinal nerve was identified and retracted superiorly from the operative field. An indirect hernia sac was found. This was freed away from the spermatic cord up to the peritoneal reflection and inverted. A large Bard PerFix plug was then inserted and secured to the transversalis fascia using a 2-0 Novafil interrupted suture. An onlay patch was then placed along the floor of the inguinal canal and secured superiorly to the conjoined tendon and inferiorly to the shelving edge of Poupart's ligament using 2-0 Novafil interrupted sutures. The internal ring was re-created using a 2-0 Novafil interrupted suture. The external oblique aponeuroses was reapproximated using a 2-0 Vicryl running suture. Subcutaneous layer was reapproximated using a 3-0 Vicryl interrupted suture. Skin was closed using a 4-0 Vicryl subcuticular suture. Exparel was instilled into the surrounding wound. Dermabond was then applied.  All tape and  needle counts were correct at the end of the procedure. The patient was awakened and transferred to PACU in stable condition.  Complications:  None  EBL:  Minimal  Specimen:  None

## 2016-03-21 NOTE — Discharge Instructions (Signed)
Open Hernia Repair, Adult, Care After This sheet gives you information about how to care for yourself after your procedure. Your health care provider may also give you more specific instructions. If you have problems or questions, contact your health care provider. What can I expect after the procedure? After the procedure, it is common to have:  Mild discomfort.  Slight bruising.  Minor swelling.  Pain in the abdomen. Follow these instructions at home: Incision care  Follow instructions from your health care provider about how to take care of your incision area. Make sure you:  Wash your hands with soap and water before you change your bandage (dressing). If soap and water are not available, use hand sanitizer.  Change your dressing as told by your health care provider.  Leave stitches (sutures), skin glue, or adhesive strips in place. These skin closures may need to stay in place for 2 weeks or longer. If adhesive strip edges start to loosen and curl up, you may trim the loose edges. Do not remove adhesive strips completely unless your health care provider tells you to do that.  Check your incision area every day for signs of infection. Check for:  More redness, swelling, or pain.  More fluid or blood.  Warmth.  Pus or a bad smell. Activity  Do not drive or use heavy machinery while taking prescription pain medicine. Do not drive until your health care provider approves.  Until your health care provider approves:  Do not lift anything that is heavier than 10 lb (4.5 kg).  Do not play contact sports.  Return to your normal activities as told by your health care provider. Ask your health care provider what activities are safe. General instructions  To prevent or treat constipation while you are taking prescription pain medicine, your health care provider may recommend that you:  Drink enough fluid to keep your urine clear or pale yellow.  Take over-the-counter or  prescription medicines.  Eat foods that are high in fiber, such as fresh fruits and vegetables, whole grains, and beans.  Limit foods that are high in fat and processed sugars, such as fried and sweet foods.  Take over-the-counter and prescription medicines only as told by your health care provider.  Do not take tub baths or go swimming until your health care provider approves.  Keep all follow-up visits as told by your health care provider. This is important. Contact a health care provider if:  You develop a rash.  You have more redness, swelling, or pain around your incision.  You have more fluid or blood coming from your incision.  Your incision feels warm to the touch.  You have pus or a bad smell coming from your incision.  You have a fever or chills.  You have blood in your stool (feces).  You have not had a bowel movement in 2-3 days.  Your pain is not controlled with medicine. Get help right away if:  You have chest pain or shortness of breath.  You feel light-headed or feel faint.  You have severe pain.  You vomit and your pain is worse. This information is not intended to replace advice given to you by your health care provider. Make sure you discuss any questions you have with your health care provider. Document Released: 08/16/2004 Document Revised: 08/17/2015 Document Reviewed: 07/11/2015 Elsevier Interactive Patient Education  2017 [employer] Inguinal Hernia, Adult Introduction An inguinal hernia is when fat or the intestines push through the area where the  leg meets the lower belly (groin) and make a rounded lump (bulge). This condition happens over time. There are three types of inguinal hernias. These types include:  Hernias that can be pushed back into the belly (are reducible).  Hernias that cannot be pushed back into the belly (are incarcerated).  Hernias that cannot be pushed back into the belly and lose their blood supply (get strangulated).  This type needs emergency surgery. Follow these instructions at home: Lifestyle  Drink enough fluid to keep your urine (pee) clear or pale yellow.  Eat plenty of fruits, vegetables, and whole grains. These have a lot of fiber. Talk with your doctor if you have questions.  Avoid lifting heavy objects.  Avoid standing for long periods of time.  Do not use tobacco products. These include cigarettes, chewing tobacco, or e-cigarettes. If you need help quitting, ask your doctor.  Try to stay at a healthy weight. General instructions  Do not try to force the hernia back in.  Watch your hernia for any changes in color or size. Let your doctor know if there are any changes.  Take over-the-counter and prescription medicines only as told by your doctor.  Keep all follow-up visits as told by your doctor. This is important. Contact a doctor if:  You have a fever.  You have new symptoms.  Your symptoms get worse. Get help right away if:  The area where the legs meets the lower belly has:  Pain that gets worse suddenly.  A bulge that gets bigger suddenly and does not go down.  A bulge that turns red or purple.  A bulge that is painful to the touch.  You are a man and your scrotum:  Suddenly feels painful.  Suddenly changes in size.  You feel sick to your stomach (nauseous) and this feeling does not go away.  You throw up (vomit) and this keeps happening.  You feel your heart beating a lot more quickly than normal.  You cannot poop (have a bowel movement) or pass gas. This information is not intended to replace advice given to you by your health care provider. Make sure you discuss any questions you have with your health care provider. Document Released: 02/27/2006 Document Revised: 07/05/2015 Document Reviewed: 12/07/2013  2017 Elsevier

## 2016-03-21 NOTE — Anesthesia Postprocedure Evaluation (Signed)
Anesthesia Post Note  Patient: Aubery Samek  Procedure(s) Performed: Procedure(s) (LRB): RIGHT INGUINAL HERNIORRHAPY WITH MESH (Right)  Patient location during evaluation: PACU Anesthesia Type: General Level of consciousness: awake and alert and patient cooperative Pain management: pain level controlled Vital Signs Assessment: post-procedure vital signs reviewed and stable Respiratory status: spontaneous breathing, nonlabored ventilation and respiratory function stable Cardiovascular status: blood pressure returned to baseline Postop Assessment: no signs of nausea or vomiting Anesthetic complications: no     Last Vitals:  Vitals:   03/21/16 1003 03/21/16 1015  BP: (!) 129/93 (!) 133/94  Pulse: 60 (!) 56  Resp: 12 14  Temp: 36.6 C     Last Pain:  Vitals:   03/21/16 1003  TempSrc:   PainSc: 7                  Ziare Orrick J

## 2016-03-21 NOTE — Transfer of Care (Signed)
Immediate Anesthesia Transfer of Care Note  Patient: Matthew Sims  Procedure(s) Performed: Procedure(s): RIGHT INGUINAL HERNIORRHAPY WITH MESH (Right)  Patient Location: PACU  Anesthesia Type:General  Level of Consciousness: awake and patient cooperative  Airway & Oxygen Therapy: Patient Spontanous Breathing and Patient connected to face mask oxygen  Post-op Assessment: Report given to RN, Post -op Vital signs reviewed and stable and Patient moving all extremities  Post vital signs: Reviewed and stable  Last Vitals:  Vitals:   03/21/16 0835 03/21/16 0840  BP: 101/71 108/74  Pulse:    Resp: (!) 21 18  Temp:      Last Pain:  Vitals:   03/21/16 0744  TempSrc: Oral  PainSc: 3       Patients Stated Pain Goal: 5 (Q000111Q Q000111Q)  Complications: No apparent anesthesia complications

## 2016-03-21 NOTE — Anesthesia Procedure Notes (Addendum)
Procedure Name: LMA Insertion Date/Time: 03/21/2016 8:58 AM Performed by: Tressie Stalker E Pre-anesthesia Checklist: Patient identified, Patient being monitored, Emergency Drugs available, Timeout performed and Suction available Patient Re-evaluated:Patient Re-evaluated prior to inductionOxygen Delivery Method: Circle System Utilized Preoxygenation: Pre-oxygenation with 100% oxygen Intubation Type: IV induction Ventilation: Mask ventilation without difficulty LMA: LMA inserted LMA Size: 4.0 Number of attempts: 1 Placement Confirmation: positive ETCO2 and breath sounds checked- equal and bilateral Comments: Attempted insertion after 200mg  propofol, too light. Stopped and attempted after 275mg  propofol and tolerated without problems.

## 2016-03-24 ENCOUNTER — Encounter (HOSPITAL_COMMUNITY): Payer: Self-pay | Admitting: General Surgery

## 2016-04-29 ENCOUNTER — Ambulatory Visit (INDEPENDENT_AMBULATORY_CARE_PROVIDER_SITE_OTHER): Payer: Self-pay | Admitting: General Surgery

## 2016-04-29 ENCOUNTER — Encounter: Payer: Self-pay | Admitting: General Surgery

## 2016-04-29 VITALS — BP 128/76 | HR 70 | Temp 98.4°F | Resp 18 | Ht 64.0 in | Wt 204.0 lb

## 2016-04-29 DIAGNOSIS — Z09 Encounter for follow-up examination after completed treatment for conditions other than malignant neoplasm: Secondary | ICD-10-CM

## 2016-04-29 NOTE — Progress Notes (Signed)
Subjective:     Matthew Sims  Status post right inguinal herniorrhaphy with mesh. Progressing well. Still some numbness around the incision. Objective:    BP 128/76   Pulse 70   Temp 98.4 F (36.9 C)   Resp 18   Ht 5\' 4"  (1.626 m)   Wt 204 lb (92.5 kg)   BMI 35.02 kg/m   General:  alert, cooperative and no distress  Abdomen soft. Incision healing well. Inguinal incision healing well.     Assessment:    Doing well postoperatively.    Plan:  May return to work on 05/06/2006 without restrictions. Follow up here as needed.

## 2016-05-27 ENCOUNTER — Telehealth: Payer: Self-pay | Admitting: General Surgery

## 2016-05-27 NOTE — Telephone Encounter (Signed)
Patient had some mild incisional pain after lifting something heavy. I told him this was not uncommon. I did instruct him to return to my care at this did not clear up in the next 2 weeks. He was fine with that.

## 2016-09-06 IMAGING — NM NM MISC PROCEDURE
6 series · 36 of 36 positions shown · non-contrast
Comparison: none

[Series 1: wbr_r-proj_st wbr rest · 6.40mm/px · 6 of 64 frames shown]
[frame 6/64]
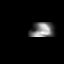
[frame 16/64]
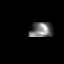
[frame 27/64]
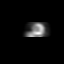
[frame 38/64]
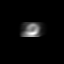
[frame 48/64]
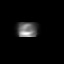
[frame 59/64]
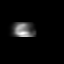

[Series 1: wbr rest · 6.40mm/px · 6 of 64 frames shown]
[frame 6/64]
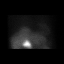
[frame 16/64]
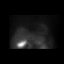
[frame 27/64]
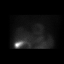
[frame 38/64]
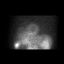
[frame 48/64]
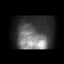
[frame 59/64]
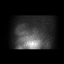

[Series 2: wbr_s-proj_st wbr stress-gsp · 6.40mm/px · 6 of 512 frames shown]
[frame 43/512]
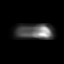
[frame 128/512]
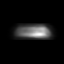
[frame 214/512]
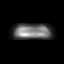
[frame 299/512]
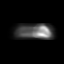
[frame 384/512]
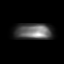
[frame 470/512]
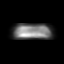

[Series 2: wbr stress-gsp · 6.40mm/px · 6 of 512 frames shown]
[frame 43/512]
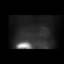
[frame 128/512]
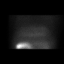
[frame 214/512]
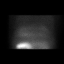
[frame 299/512]
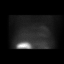
[frame 384/512]
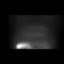
[frame 470/512]
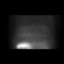

[Series 3: wbr stress-sum-em · 6.40mm/px · 6 of 64 frames shown]
[frame 6/64]
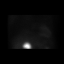
[frame 16/64]
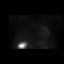
[frame 27/64]
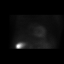
[frame 38/64]
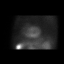
[frame 48/64]
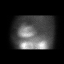
[frame 59/64]
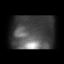

[Series 3: wbr_s-proj_st wbr stress-sum-em · 6.40mm/px · 6 of 64 frames shown]
[frame 6/64]
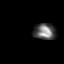
[frame 16/64]
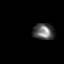
[frame 27/64]
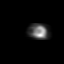
[frame 38/64]
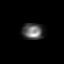
[frame 48/64]
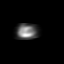
[frame 59/64]
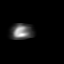

[36 of 36 positions shown; findings below may reference images not displayed]

Canned report from images found in remote index.

Refer to host system for actual result text.

## 2016-11-17 ENCOUNTER — Encounter: Payer: Self-pay | Admitting: Family Medicine

## 2016-11-17 ENCOUNTER — Ambulatory Visit (INDEPENDENT_AMBULATORY_CARE_PROVIDER_SITE_OTHER): Payer: BLUE CROSS/BLUE SHIELD | Admitting: Family Medicine

## 2016-11-17 VITALS — BP 114/78 | HR 56 | Temp 98.2°F | Resp 20 | Ht 64.0 in | Wt 196.0 lb

## 2016-11-17 DIAGNOSIS — Z131 Encounter for screening for diabetes mellitus: Secondary | ICD-10-CM

## 2016-11-17 DIAGNOSIS — Z720 Tobacco use: Secondary | ICD-10-CM

## 2016-11-17 DIAGNOSIS — Z125 Encounter for screening for malignant neoplasm of prostate: Secondary | ICD-10-CM

## 2016-11-17 DIAGNOSIS — Z6833 Body mass index (BMI) 33.0-33.9, adult: Secondary | ICD-10-CM | POA: Diagnosis not present

## 2016-11-17 DIAGNOSIS — Z0000 Encounter for general adult medical examination without abnormal findings: Secondary | ICD-10-CM | POA: Diagnosis not present

## 2016-11-17 DIAGNOSIS — Z130 Encounter for screening for diseases of the blood and blood-forming organs and certain disorders involving the immune mechanism: Secondary | ICD-10-CM | POA: Diagnosis not present

## 2016-11-17 DIAGNOSIS — Z13220 Encounter for screening for lipoid disorders: Secondary | ICD-10-CM

## 2016-11-17 LAB — COMPREHENSIVE METABOLIC PANEL
ALT: 17 U/L (ref 0–53)
AST: 16 U/L (ref 0–37)
Albumin: 4 g/dL (ref 3.5–5.2)
Alkaline Phosphatase: 58 U/L (ref 39–117)
BUN: 13 mg/dL (ref 6–23)
CO2: 29 mEq/L (ref 19–32)
Calcium: 9.6 mg/dL (ref 8.4–10.5)
Chloride: 105 mEq/L (ref 96–112)
Creatinine, Ser: 0.89 mg/dL (ref 0.40–1.50)
GFR: 93.26 mL/min (ref >60.00)
Glucose, Bld: 96 mg/dL (ref 70–99)
Potassium: 5.1 mEq/L (ref 3.5–5.1)
Sodium: 138 mEq/L (ref 135–145)
Total Bilirubin: 0.6 mg/dL (ref 0.2–1.2)
Total Protein: 6.5 g/dL (ref 6.0–8.3)

## 2016-11-17 LAB — CBC WITH DIFFERENTIAL/PLATELET
Basophils Absolute: 0 10*3/uL (ref 0.0–0.1)
Basophils Relative: 0.9 % (ref 0.0–3.0)
Eosinophils Absolute: 0.1 10*3/uL (ref 0.0–0.7)
Eosinophils Relative: 1.6 % (ref 0.0–5.0)
HCT: 47.9 % (ref 39.0–52.0)
Hemoglobin: 16 g/dL (ref 13.0–17.0)
Lymphocytes Relative: 27.2 % (ref 12.0–46.0)
Lymphs Abs: 1.5 10*3/uL (ref 0.7–4.0)
MCHC: 33.3 g/dL (ref 30.0–36.0)
MCV: 92.2 fl (ref 78.0–100.0)
Monocytes Absolute: 0.5 10*3/uL (ref 0.1–1.0)
Monocytes Relative: 8.6 % (ref 3.0–12.0)
Neutro Abs: 3.3 10*3/uL (ref 1.4–7.7)
Neutrophils Relative %: 61.7 % (ref 43.0–77.0)
Platelets: 261 10*3/uL (ref 150.0–400.0)
RBC: 5.2 Mil/uL (ref 4.22–5.81)
RDW: 14.3 % (ref 11.5–15.5)
WBC: 5.3 10*3/uL (ref 4.0–10.5)

## 2016-11-17 LAB — LIPID PANEL
Cholesterol: 154 mg/dL (ref 0–200)
HDL: 56.4 mg/dL (ref >39.00)
LDL Cholesterol: 83 mg/dL (ref 0–99)
NonHDL: 97.45
Total CHOL/HDL Ratio: 3
Triglycerides: 71 mg/dL (ref 0.0–149.0)
VLDL: 14.2 mg/dL (ref 0.0–40.0)

## 2016-11-17 LAB — PSA: PSA: 1.31 ng/mL (ref 0.10–4.00)

## 2016-11-17 LAB — HEMOGLOBIN A1C: Hgb A1c MFr Bld: 5.5 % (ref 4.6–6.5)

## 2016-11-17 NOTE — Patient Instructions (Signed)

## 2016-11-17 NOTE — Progress Notes (Signed)
Patient ID: Matthew Sims, male  DOB: Feb 06, 1961, 56 y.o.   MRN: [id number] Patient Care Team    Relationship Specialty Notifications Start End  Ma Hillock, DO PCP - General Family Medicine  06/01/15   Milus Banister, MD Attending Physician Gastroenterology  11/19/15   Minus Breeding, MD Consulting Physician Cardiology  11/19/15     Subjective:  Matthew Sims is a 56 y.o. male present for CPE. All past medical history, surgical history, allergies, family history, immunizations, medications and social history were updated in the electronic medical record today. All recent labs, ED visits and hospitalizations within the last year were reviewed.  Pt reports chronic back discomfort. He denies current back pain.   Health maintenance:  Colonoscopy: last screen 06/2015, recommend follow up 3 years, polyps present. Completed by Dr. Ardis Hughs. Immunizations:  tdap 2017 UTD. Influenza declined.  Infectious disease screening: HIV and Hep C completed.  PSA: Pt was counseled on prostate cancer screenings. PSA completed today. NL 2014. Assistive device: None Oxygen use: None Patient has a Dental home. Hospitalizations/ED visits: None   Immunization History  Administered Date(s) Administered  . Tdap 11/19/2015    Past Medical History:  Diagnosis Date  . Chicken pox   . Colon polyps   . COPD (chronic obstructive pulmonary disease) (HCC)    no inhaler   Allergies  Allergen Reactions  . Bee Venom Swelling    Swelling around the area / hands / lips Difficulty in swallowing   Past Surgical History:  Procedure Laterality Date  . HERNIA REPAIR  1740   umbilical hernia  . INGUINAL HERNIA REPAIR Right 03/21/2016   Procedure: RIGHT INGUINAL HERNIORRHAPY WITH MESH;  Surgeon: Aviva Signs, MD;  Location: [facility];  Service: General;  Laterality: Right;  . ROTATOR CUFF REPAIR Right 2000, 2010  . rotator cuff tear Left 2001  . SHOULDER SURGERY    . TONSILLECTOMY  1966   Family History    Problem Relation Age of Onset  . Breast cancer Mother   . COPD Mother   . Alcohol abuse Maternal Grandfather   . Colon cancer Neg Hx   . Colon polyps Neg Hx   . Esophageal cancer Neg Hx   . Rectal cancer Neg Hx   . Stomach cancer Neg Hx    Social History   Social History  . Marital status: Single    Spouse name: N/A  . Number of children: N/A  . Years of education: N/A   Occupational History  . Not on file.   Social History Main Topics  . Smoking status: Current Every Day Smoker    Packs/day: 1.00    Years: 43.00    Types: Cigarettes  . Smokeless tobacco: Never Used  . 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 Drug use: No  . Sexual activity: Yes    Birth control/ protection: None   Other Topics Concern  . Not on file   Social History Narrative   Single. Employed. High school education.    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   Smokes daily.    Wears seat belt.    Exercises  3 times a week.    Smoke alarm in the home.    Guns in the home.    Allergies as of 11/17/2016      Reactions   Bee Venom Swelling   Swelling around the area / hands / lips  Difficulty in swallowing      Medication List       Accurate as of 11/17/16  9:31 AM. Always use your most recent med list.          EPINEPHrine 0.3 mg/0.3 mL Soaj injection Commonly known as:  EPI-PEN Inject 0.3 mLs (0.3 mg total) into the muscle once.       No results found for this or any previous visit (from the past 2160 hour(s)).  No results found.  ROS: 14 pt review of systems performed and negative (unless mentioned in an HPI)  Objective: BP 114/78 (BP Location: Right Arm, Patient Position: Sitting, Cuff Size: Large)   Pulse (!) 56   Temp 98.2 F (36.8 C)   Resp 20   Ht 5\' 4"  (1.626 m)   Wt 196 lb (88.9 kg)   SpO2 97%   BMI 33.64 kg/m  Gen: Afebrile. No acute distress. Nontoxic in appearance, well developed, well nourished.  HENT: AT. [place]. Bilateral TM  visualized and normal in appearance. MMM. Bilateral nares without erythema, bulging. Throat without erythema or exudates. No cough, no hoarseness.  Eyes:Pupils Equal Round Reactive to light, Extraocular movements intact,  Conjunctiva without redness, discharge or icterus. Neck/lymp/endocrine: Supple,no lymphadenopathy, no thyromegaly CV: RRR no murmur, no edema, +2/4 P posterior tibialis pulses Chest: CTAB, no wheeze or crackles Abd: Soft. Well healed right inguinal scar. obese. NTND. BS present. no Masses palpated.  MSK: no erythema, swelling, or joint deformities. Mild curvature of thoracic and lumbar spine.  NV intact distally.  Skin: no rashes, purpura or petechiae.  Neuro:  Normal gait. PERLA. EOMi. Alert. Oriented. Cranial nerves II through XII intact. Muscle strength 5/5 upper/lower extremity. DTRs equal bilaterally. Psych: Normal affect, dress and demeanor. Normal speech. Normal thought content and judgment.  Assessment/plan: Matthew Sims is a 56 y.o. male present for CPE Encounter for preventive health examination Patient was encouraged to exercise greater than 150 minutes a week. Patient was encouraged to choose a diet filled with fresh fruits and vegetables, and lean meats. AVS provided to patient today for education/recommendation on gender specific health and safety maintenance. Colonoscopy: last screen 06/2015, recommend follow up 3 years, polyps present.Completed by Dr. Ardis Hughs. Immunizations:  tdap 2017. Influenza declined today Infectious disease screening: HIV and Hep C completed.  PSA: Pt was counseled on prostate cancer screenings. Completed today.  Tobacco abuse: pt declines smoking cessation today. BMI 33: diet and exercise recommended.  * pt encouraged to make an appt for back pain if he would like evaluated for it. Advised him to be seen when he has next flare.   Return in about 1 year (around 11/17/2017) for CPE.  Electronically signed by: Howard Pouch, DO [place]

## 2016-11-19 ENCOUNTER — Encounter: Payer: BLUE CROSS/BLUE SHIELD | Admitting: Family Medicine

## 2017-01-10 DIAGNOSIS — G8929 Other chronic pain: Secondary | ICD-10-CM

## 2017-01-10 HISTORY — DX: Other chronic pain: G89.29

## 2017-01-29 ENCOUNTER — Encounter: Payer: Self-pay | Admitting: Family Medicine

## 2017-01-29 ENCOUNTER — Ambulatory Visit: Payer: Self-pay | Admitting: *Deleted

## 2017-01-29 ENCOUNTER — Ambulatory Visit: Payer: BLUE CROSS/BLUE SHIELD | Admitting: Family Medicine

## 2017-01-29 VITALS — BP 116/73 | HR 83 | Resp 16 | Wt 201.0 lb

## 2017-01-29 DIAGNOSIS — M5442 Lumbago with sciatica, left side: Secondary | ICD-10-CM

## 2017-01-29 DIAGNOSIS — R29898 Other symptoms and signs involving the musculoskeletal system: Secondary | ICD-10-CM | POA: Diagnosis not present

## 2017-01-29 MED ORDER — OXYCODONE HCL 5 MG PO TABS: ORAL_TABLET | ORAL | 0 refills | Status: DC

## 2017-01-29 NOTE — Telephone Encounter (Signed)
Pt called with complaints of sciatica that started on 01/21/17; he states to urgent care on 01/26/17 and received medications but hey are not working; he also states that he is not able to move out of bed without pushing on the mattress; pt also states that he has a history of scoliosis; nurse triage initiated and per protocol pt advised to see physician within 24 hours; pt offered and accepted a 1515 appointment with Dr Anitra Lauth; pt verbalizes understanding.j   Reason for Disposition . Numbness in a leg or foot (i.e., loss of sensation)  Answer Assessment - Initial Assessment Questions 1. ONSET: "When did the pain begin?"      01/21/17 2. LOCATION: "Where does it hurt?" (upper, mid or lower back)     "Lower left near butt cheek" 3. SEVERITY: "How bad is the pain?"  (e.g., Scale 1-10; mild, moderate, or severe)   - MILD (1-3): doesn't interfere with normal activities    - MODERATE (4-7): interferes with normal activities or awakens from sleep    - SEVERE (8-10): excruciating pain, unable to do any normal activities      severe 4. PATTERN: "Is the pain constant?" (e.g., yes, no; constant, intermittent)      Constant pain and always sore 5. RADIATION: "Does the pain shoot into your legs or elsewhere?"     Left leg 6. CAUSE:  "What do you think is causing the back pain?"      Recent diagnosis of sciatica 7. BACK OVERUSE:  "Any recent lifting of heavy objects, strenuous work or exercise?"     Walk all day on concrete floors picking up boxes 8. MEDICATIONS: "What have you taken so far for the pain?" (e.g., nothing, acetaminophen, NSAIDS)     steroid shot and muscle relaxer at urgent care, prednisone and cyclobenzaprine prescriptions 9. NEUROLOGIC SYMPTOMS: "Do you have any weakness, numbness, or problems with bowel/bladder control?"     no 10. OTHER SYMPTOMS: "Do you have any other symptoms?" (e.g., fever, abdominal pain, burning with urination, blood in urine)       no 11. PREGNANCY: "Is  there any chance you are pregnant?" (e.g., yes, no; LMP)       n/a  Protocols used: BACK PAIN-A-AH

## 2017-01-29 NOTE — Progress Notes (Signed)
OFFICE VISIT  01/29/2017   CC:  Chief Complaint  Patient presents with  . Follow-up    left lower back pain   HPI:    Patient is a 56 y.o.  male who presents for "sciatica". About 8 day hx pain in L LB that runs down leg intermittently.  No paresthesias.  Sitting--no radiation.  Standing--starts radiating. Bowel/bladder control intact.   Intensity "12/10" at it's worst.  Currently 5/10 intensity. Describes hx of recurrent LB pain--"goes out" sometimes. Four days ago pt went to an UC and was given an IM steroid and also rx'd prednisone and pain med---this helped for a while.  Of note, no back imaging is found in the EMR dating back to 2008. No history of back or hip surgery. No trauma preceding his pain, no strain recalled. No fevers or malaise or abnl weight loss.  Past Medical History:  Diagnosis Date  . Alcoholism ([place])    Abstinent since 2012.  Marland Kitchen Chicken pox   . Colon polyps   . COPD (chronic obstructive pulmonary disease) (HCC)    no inhaler    Past Surgical History:  Procedure Laterality Date  . HERNIA REPAIR  1610   umbilical hernia  . INGUINAL HERNIA REPAIR Right 03/21/2016   Procedure: RIGHT INGUINAL HERNIORRHAPY WITH MESH;  Surgeon: Aviva Signs, MD;  Location: [facility];  Service: General;  Laterality: Right;  . ROTATOR CUFF REPAIR Right 2000, 2010  . rotator cuff tear Left 2001  . SHOULDER SURGERY    . TONSILLECTOMY  1966    Outpatient Medications Prior to Visit  Medication Sig Dispense Refill  . EPINEPHrine 0.3 mg/0.3 mL IJ SOAJ injection Inject 0.3 mLs (0.3 mg total) into the muscle once. 1 Device 0   No facility-administered medications prior to visit.     Allergies  Allergen Reactions  . Bee Venom Swelling    Swelling around the area / hands / lips Difficulty in swallowing    ROS As per HPI  PE: Blood pressure 116/73, pulse 83, resp. rate 16, weight 201 lb (91.2 kg), SpO2 97 %. Gen: Alert, well appearing.  Patient is oriented to person,  place, time, and situation. AFFECT: pleasant, lucid thought and speech. He moves with extreme guarding/limp when going into standing position--wobbles on both legs for 5 seconds or so. ROM of L spine good: with some pain in LB with forward flexion to 90 deg and with lateral bending to L. No TTP of low back or of ischial tuberosity on either side. DTRs 3+ patella bilat, 1+achilles on R, trace on L.  No clonus. Sitting SLR + at 45 deg on L. Strength: 4/5 prox on L, distal strength 5/5.  R leg strength 5/5 bilat. Toe raises ok on both sides.  LABS:    Chemistry      Component Value Date/Time   NA 138 11/17/2016 0925   NA 141 01/26/2013 1432   K 5.1 11/17/2016 0925   CL 105 11/17/2016 0925   CO2 29 11/17/2016 0925   BUN 13 11/17/2016 0925   BUN 13 01/26/2013 1432   CREATININE 0.89 11/17/2016 0925      Component Value Date/Time   CALCIUM 9.6 11/17/2016 0925   ALKPHOS 58 11/17/2016 0925   AST 16 11/17/2016 0925   ALT 17 11/17/2016 0925   BILITOT 0.6 11/17/2016 0925       IMPRESSION AND PLAN:  Acute left sided LBP with radiculopathy (pain + mild proximal weakness+ slight decreased achilles DTR). Severe pain.  No improvement with recent IM and PO steroids rx'd by UC--will get records. Suspect acute disc herniation with spinal nerve compression, symptoms progressively worsening--particularly pt's left leg weakness. Will obtain lumbar x-ray. Refer to PT. Oxycodone 5mg , 1-2 tabs po tid prn, #42 (7 day supply).  We did discuss the fact that since pt has hx of alcoholism, he is at much higher risk for addiction to narcotics, and any narcotic prescribing would only be done on a short term basis.  Pt expressed understanding and agreement. When pred pack finished, take 440mg  aleve bid x 10d. Ordered MRI L spine as well.  He is unable to work at this time: I wrote a letter excusing from the days of work he missed from this back pain + the next 1 week until next f/u visit.  An After Visit  Summary was printed and given to the patient.  FOLLOW UP: Return in about 7 days (around 02/05/2017) for f/u LBP with either Dr. Raoul Pitch or myself.  Signed:  Crissie Sickles, MD           01/29/2017

## 2017-02-07 ENCOUNTER — Other Ambulatory Visit: Payer: Self-pay | Admitting: Family Medicine

## 2017-02-07 ENCOUNTER — Ambulatory Visit (HOSPITAL_BASED_OUTPATIENT_CLINIC_OR_DEPARTMENT_OTHER)
Admission: RE | Admit: 2017-02-07 | Discharge: 2017-02-07 | Disposition: A | Discharge status: Home / Self Care | Payer: BLUE CROSS/BLUE SHIELD | Source: Ambulatory Visit | Attending: Family Medicine | Admitting: Family Medicine

## 2017-02-07 DIAGNOSIS — M5442 Lumbago with sciatica, left side: Secondary | ICD-10-CM | POA: Diagnosis present

## 2017-02-07 DIAGNOSIS — M48061 Spinal stenosis, lumbar region without neurogenic claudication: Secondary | ICD-10-CM | POA: Insufficient documentation

## 2017-02-07 DIAGNOSIS — S00259A Superficial foreign body of unspecified eyelid and periocular area, initial encounter: Secondary | ICD-10-CM

## 2017-02-07 DIAGNOSIS — R29898 Other symptoms and signs involving the musculoskeletal system: Secondary | ICD-10-CM

## 2017-02-08 ENCOUNTER — Encounter: Payer: Self-pay | Admitting: Family Medicine

## 2017-02-11 ENCOUNTER — Ambulatory Visit: Payer: BLUE CROSS/BLUE SHIELD | Attending: Family Medicine | Admitting: Physical Therapy

## 2017-02-11 ENCOUNTER — Ambulatory Visit: Payer: BLUE CROSS/BLUE SHIELD | Admitting: Family Medicine

## 2017-02-11 ENCOUNTER — Encounter: Payer: Self-pay | Admitting: Family Medicine

## 2017-02-11 ENCOUNTER — Encounter: Payer: Self-pay | Admitting: Physical Therapy

## 2017-02-11 VITALS — BP 118/77 | HR 76 | Temp 97.9°F | Wt 194.4 lb

## 2017-02-11 DIAGNOSIS — M9983 Other biomechanical lesions of lumbar region: Secondary | ICD-10-CM | POA: Diagnosis not present

## 2017-02-11 DIAGNOSIS — M4310 Spondylolisthesis, site unspecified: Secondary | ICD-10-CM | POA: Diagnosis not present

## 2017-02-11 DIAGNOSIS — M5126 Other intervertebral disc displacement, lumbar region: Secondary | ICD-10-CM

## 2017-02-11 DIAGNOSIS — M5416 Radiculopathy, lumbar region: Secondary | ICD-10-CM

## 2017-02-11 DIAGNOSIS — M5136 Other intervertebral disc degeneration, lumbar region: Secondary | ICD-10-CM | POA: Diagnosis not present

## 2017-02-11 DIAGNOSIS — M47896 Other spondylosis, lumbar region: Secondary | ICD-10-CM

## 2017-02-11 DIAGNOSIS — M5442 Lumbago with sciatica, left side: Secondary | ICD-10-CM | POA: Diagnosis present

## 2017-02-11 DIAGNOSIS — M48062 Spinal stenosis, lumbar region with neurogenic claudication: Secondary | ICD-10-CM

## 2017-02-11 DIAGNOSIS — R293 Abnormal posture: Secondary | ICD-10-CM | POA: Diagnosis present

## 2017-02-11 DIAGNOSIS — M47816 Spondylosis without myelopathy or radiculopathy, lumbar region: Secondary | ICD-10-CM

## 2017-02-11 DIAGNOSIS — M48061 Spinal stenosis, lumbar region without neurogenic claudication: Secondary | ICD-10-CM

## 2017-02-11 MED ORDER — GABAPENTIN 100 MG PO CAPS: ORAL_CAPSULE | ORAL | 0 refills | Status: DC

## 2017-02-11 NOTE — Therapy (Signed)
[facility] [place], [place], [address] Phone: [phone]   Fax:  [phone]  Physical Therapy Evaluation  Patient Details  Name: Matthew Sims MRN: [id number] Date of Birth: 1960/05/04 Referring Provider: Ricardo Jericho MD   Encounter Date: 02/11/2017  PT End of Session - 02/11/17 1103    Visit Number  1    Number of Visits  12    Date for PT Re-Evaluation  03/25/17    PT Start Time  0945    PT Stop Time  2536    PT Time Calculation (min)  54 min       Past Medical History:  Diagnosis Date  . Alcoholism ([place])    Abstinent since 2012.  Marland Kitchen Chicken pox   . Chronic low back pain 01/2017   Acute severe episode of LBP w/radiculopathy 01/2017:  MRI showed DDD/spondylosis w/mild-mod spinal stenosis  . Colon polyps   . COPD (chronic obstructive pulmonary disease) (HCC)    no inhaler    Past Surgical History:  Procedure Laterality Date  . HERNIA REPAIR  6440   umbilical hernia  . INGUINAL HERNIA REPAIR Right 03/21/2016   Procedure: RIGHT INGUINAL HERNIORRHAPY WITH MESH;  Surgeon: Aviva Signs, MD;  Location: [facility];  Service: General;  Laterality: Right;  . ROTATOR CUFF REPAIR Right 2000, 2010  . rotator cuff tear Left 2001  . SHOULDER SURGERY    . TONSILLECTOMY  1966    There were no vitals filed for this visit.   Subjective Assessment - 02/11/17 1141    Subjective  The patient reports a long history of right low back pain that would flare-up on occasions but recently (01/22/17)  he experienced severe left sided low back pain for no apparent reason.  The pain has been so bad that he has been unable to return to his job at [employer].  He reports pain into his right buttock and anterior thigh.  He also states he feels tingling into both feet.  He b requires long shifts walking on concrete and his pain would rise to a 10/10.    Pertinent History  H/o left sided low back pain.    Limitations  Standing;Walking    How long  can you stand comfortably?  10 minutes on concrete.    How long can you walk comfortably?  Short distances before pain rises.    Diagnostic tests  MRI.Marland KitchenPlease view under "Imaging" tab.    Patient Stated Goals  Get out of pain.    Currently in Pain?  Yes    Pain Score  7     Pain Location  Back    Pain Descriptors / Indicators  Aching    Pain Type  Acute pain    Pain Radiating Towards  Left anterior thigh.    Pain Onset  1 to 4 weeks ago    Pain Frequency  Constant    Aggravating Factors   See above.    Pain Relieving Factors  Rest.         [facility] PT Assessment - 02/11/17 0001      Assessment   Medical Diagnosis  Acute left-sided low back pain with sciatica.    Referring Provider  Ricardo Jericho MD    Onset Date/Surgical Date  -- 02/01/17.      Precautions   Precautions  None      Restrictions   Weight Bearing Restrictions  No      Balance Screen   Has the  patient fallen in the past 6 months  No    Has the patient had a decrease in activity level because of a fear of falling?   No    Is the patient reluctant to leave their home because of a fear of falling?   No      Home Environment   Living Environment  Private residence      Prior Function   Level of Independence  Independent      Posture/Postural Control   Posture/Postural Control  Postural limitations    Postural Limitations  Rounded Shoulders;Forward head;Decreased lumbar lordosis    Posture Comments  Right shoulder depression and atrophy in area of Infraspinatus fossa.  Bilateral genu varum.      ROM / Strength   AROM / PROM / Strength  AROM;Strength      AROM   Overall AROM Comments  Full active lumbar flexion and extension to 15 degrees.      Strength   Overall Strength Comments  Normal bilateral LE strength.      Palpation   Palpation comment  Tender to palpation over left lower lumbar region and SIJ and referred pain into buttock near ischial tuberosity.      Special Tests    Special Tests   Lumbar;Leg LengthTest Normal LE DTR's.    Lumbar Tests  -- (+)LT SLR;(+) LT FABER test.    Leg length test   -- (=) leg lengths.      Transfers   Comments  Sit to stand and supine to sit performed slowly due to pain.      Ambulation/Gait   Gait Comments  The patient walks in some spinal flexion in obvious pain.             Objective measurements completed on examination: See above findings.      [facility] Adult PT Treatment/Exercise - 02/11/17 0001      Modalities   Modalities  Electrical Stimulation;Moist Heat      Moist Heat Therapy   Number Minutes Moist Heat  20 Minutes    Moist Heat Location  Lumbar Spine      Electrical Stimulation   Electrical Stimulation Location  -- Low back.    Electrical Stimulation Action  -- IFC at 80-150 Hz x 20 minutes.    Electrical Stimulation Goals  Pain                  PT Long Term Goals - 02/11/17 1221      PT LONG TERM GOAL #1   Title  Independent with a HEP.    Time  6    Period  Weeks    Status  New      PT LONG TERM GOAL #2   Title  Stand 20 minutes wiht pain not > 3/10.    Time  6    Period  Weeks    Status  New      PT LONG TERM GOAL #3   Title  Eliminate left LE symptoms.    Time  6    Period  Weeks    Status  New      PT LONG TERM GOAL #4   Title  Perform ADL's with pain not > 3/10.    Time  6    Period  Weeks    Status  New             Plan - 02/11/17 1213    Clinical Impression Statement  The patient presents to [facility] with c/o right sided low back pain with radiation into his right buttock and right anterior thigh.  He has limited lumbar extension.  He demonstrates normal LE strength and normal LE DTR's.  He has some palpable right lower back/SIJ pain.  His current pain and deficits prevent him from returning to work at this time.  The patient will benefit from skilled physical therapy intervention.    History and Personal Factors relevant to plan of care:  H/o low back pain.  Osteopenia.   Hernia and RTC repair surgeries.    Clinical Presentation  Evolving    Clinical Presentation due to:  Symptoms not improving.      Clinical Decision Making  Moderate    Rehab Potential  Good    PT Frequency  2x / week    PT Duration  6 weeks    PT Treatment/Interventions  ADLs/Self Care Home Management;Cryotherapy;Electrical Stimulation;Moist Heat;Ultrasound;Therapeutic exercise;Therapeutic activities;Patient/family education;Manual techniques;Dry needling    PT Next Visit Plan  Modalites to low back as needed.  Progress into a core exercise program.    Consulted and Agree with Plan of Care  Patient       Patient will benefit from skilled therapeutic intervention in order to improve the following deficits and impairments:  Decreased activity tolerance, Decreased range of motion, Postural dysfunction, Pain  Visit Diagnosis: Acute left-sided low back pain with left-sided sciatica - Plan: PT plan of care cert/re-cert  Abnormal posture - Plan: PT plan of care cert/re-cert     Problem List Patient Active Problem List   Diagnosis Date Noted  . BMI 33.0-33.9,adult 11/17/2016  . History of alcohol abuse 11/16/2014  . Tobacco abuse 11/16/2014    Reign Dziuba, [place] MPT 02/11/2017, 12:24 PM  [facility] [address] [place], [place], [address] Phone: [phone]   Fax:  [phone]  Name: Matthew Sims MRN: [id number] Date of Birth: 1960-08-22

## 2017-02-11 NOTE — Progress Notes (Signed)
Matthew Sims , [birth year], 57 y.o., male MRN: [id number] Patient Care Team    Relationship Specialty Notifications Start End  Ma Hillock, DO PCP - General Family Medicine  06/01/15   Milus Banister, MD Attending Physician Gastroenterology  11/19/15   Minus Breeding, MD Consulting Physician Cardiology  11/19/15     Chief Complaint  Patient presents with  . Back Pain    follow up.      Subjective: Pt presents for an OV follow-up on lower lumbar pain with weakness and radiculopathy to his left lower extremity. He was seen at an urgent care on December 17 and  by my partner on 01/29/2017 concerning condition. Patient was provided with prednisone taper and Flexeril from the urgent care, after provided with a Toradol and methylprednisone injection. He feels that it helped short-term, but then pain returned quickly. When seen December 20 an x-ray of the lumbar spine and MRI was completed. MR significant for multilevel lumbar disc protrusions, tears, often stenosis and spinal stenosis. He has been taking Aleve twice a day. He was referred to physical therapy which he states he seen for the first time today. He tolerated TENS unit application today. He states that pain is worsened when walking on a hard floor. Climbing stairs are difficult because he feels his left leg is too weak to keep him stable. He endorses continue lower back pain is tolerable on NSAIDs, until walking or standing. He endorses tingling sensation in bilateral feet L>R. he endorses pain that radiates to his left leg and foot. He reports control of her bladder and bowel, but does feel that he has been more constipated and endorses urinary dribbling and sensation of Incomplete emptying of bladder.  MRI lumbar 02/07/2017: FINDINGS:  Segmentation: 5 non rib-bearing lumbar type vertebral bodies are  present.  Alignment: Slight retrolisthesis is present at L1-2. AP alignment is  otherwise anatomic. There is some straightening of  the normal lumbar  lordosis.  Vertebrae: Each of which acute are present at L2, L3, and L4. A  superior endplate Schmorl's node is present at L5. There is edema  associated with these lesions, particularly at L2 and L4.  Conus medullaris and cauda equina: Conus extends to the L1 level.  Conus and cauda equina appear normal.  Paraspinal and other soft tissues: Limited imaging of the abdomen is  unremarkable. There is no significant adenopathy.  Disc levels:  L1-2: A mild broad-based disc protrusion is present. Mild foraminal  narrowing is worse on the right.  L2-3: A leftward disc protrusion is present. The central canal is  patent. Mild foraminal narrowing is present bilaterally. Short  pedicles contribute.  L3-4: A broad-based disc protrusion is present. Short pedicles and  mild facet hypertrophy are present. Mild foraminal narrowing is  noted bilaterally.  L4-5: A rightward disc protrusion and annular tear is present. Mild  facet hypertrophy is noted bilaterally. This results in mild right  subarticular narrowing. Moderate right and mild left foraminal  stenosis is present.  L5-S1: A leftward disc protrusion and annular tear present. The  central canal is patent. Mild left foraminal narrowing is present.  The right foramen is patent.  IMPRESSION:  1. Congenital and acquired spinal stenosis lead to mild foraminal  narrowing bilaterally at L2-3, L3-4 and greatest at L4-5.  2. Mild right subarticular narrowing at L4-5 with moderate right and  mild left foraminal stenosis at this level.  3. Mild left foraminal narrowing at L5-S1 secondary to facet  spurring.  4. Prominent endplate Schmorl's nodes with surrounding edema as  described. This could contribute to the pain as well.  Depression screen [facility] 2/9 11/17/2016 11/19/2015 02/22/2015  Decreased Interest 0 0 0  Down, Depressed, Hopeless 0 0 0  PHQ - 2 Score 0 0 0    Allergies  Allergen Reactions  . Bee Venom Swelling     Swelling around the area / hands / lips Difficulty in swallowing   Social History   Tobacco Use  . Smoking status: Current Every Day Smoker    Packs/day: 1.00    Years: 43.00    Pack years: 43.00    Types: Cigarettes  . Smokeless tobacco: Never Used  Substance Use Topics  . Alcohol use: No    Alcohol/week: 0.0 oz    Comment: sober since 2012   Past Medical History:  Diagnosis Date  . Alcoholism ([place])    Abstinent since 2012.  Marland Kitchen Chicken pox   . Chronic low back pain 01/2017   Acute severe episode of LBP w/radiculopathy 01/2017:  MRI showed DDD/spondylosis w/mild-mod spinal stenosis  . Colon polyps   . COPD (chronic obstructive pulmonary disease) (HCC)    no inhaler   Past Surgical History:  Procedure Laterality Date  . HERNIA REPAIR  6967   umbilical hernia  . INGUINAL HERNIA REPAIR Right 03/21/2016   Procedure: RIGHT INGUINAL HERNIORRHAPY WITH MESH;  Surgeon: Aviva Signs, MD;  Location: [facility];  Service: General;  Laterality: Right;  . ROTATOR CUFF REPAIR Right 2000, 2010  . rotator cuff tear Left 2001  . SHOULDER SURGERY    . TONSILLECTOMY  1966   Family History  Problem Relation Age of Onset  . Breast cancer Mother   . COPD Mother   . Alcohol abuse Maternal Grandfather   . Colon cancer Neg Hx   . Colon polyps Neg Hx   . Esophageal cancer Neg Hx   . Rectal cancer Neg Hx   . Stomach cancer Neg Hx    Allergies as of 02/11/2017      Reactions   Bee Venom Swelling   Swelling around the area / hands / lips Difficulty in swallowing      Medication List        Accurate as of 02/11/17  1:39 PM. Always use your most recent med list.          EPINEPHrine 0.3 mg/0.3 mL Soaj injection Commonly known as:  EPI-PEN Inject 0.3 mLs (0.3 mg total) into the muscle once.   oxyCODONE 5 MG immediate release tablet Commonly known as:  Oxy IR/ROXICODONE 1-2 tabs po tid prn       All past medical history, surgical history, allergies, family history, immunizations  andmedications were updated in the EMR today and reviewed under the history and medication portions of their EMR.     ROS: Negative, with the exception of above mentioned in HPI   Objective:  BP 118/77 (BP Location: Left Arm, Patient Position: Sitting, Cuff Size: Large)   Pulse 76   Temp 97.9 F (36.6 C)   Wt 194 lb 6.4 oz (88.2 kg)   SpO2 98%   BMI 33.37 kg/m  Body mass index is 33.37 kg/m. Gen: Afebrile. No acute distress. Nontoxic in appearance, well developed, well nourished.  HENT: AT. [place]. MMM, no oral lesions.  Eyes:Pupils Equal Round Reactive to light, Extraocular movements intact,  Conjunctiva without redness, discharge or icterus. CV: RRR  Chest: CTAB, no wheeze or crackles.  Abd:  Soft. NTND. BS present. no Masses palpated.  MSK/neuro: Walking with a limp, guarded gait. No erythema, no lumbar soft tissue swelling. No bony tenderness. Good range of motion of lumbar spine with mild discomfort left side bending only today. SLR + left. Deep tendon reflexes decreased L4 and S1 left compared to right. Weakness present ext/flexion distal leg, dorsiflexion and plantarflexion left.   No exam data present No results found. No results found for this or any previous visit (from the past 24 hour(s)).  Assessment/Plan: Donald Memoli is a 57 y.o. male present for OV for  Protrusion of lumbar intervertebral disc Facet hypertrophy of lumbar region Lumbar foraminal stenosis Annular tear of lumbar disc Retrolisthesis of vertebrae Spinal stenosis of lumbar region with neurogenic claudication Lumbar back pain with radiculopathy affecting left lower extremity - Approximately 4 weeks of lumbar back pain with left radiculopathy and weakness. MRI rather significant for multilevel disc protrusions, tears and stenosis. Patient's condition is not improving, despite conservative management last 3-4 weeks. - Continue PT as long as symptoms do not worsen with tx. If worsening dc.  - Start gabapentin  taper, instructions provided to patient. - Continue NSAID therapy.  - Discussed referral to neurosurgery/neurologist given patient is not improving and MRI is rather significant. Ambulatory referral to Neurosurgery - PT was provided for a written excuse to be out of work another 4 weeks. FMLA papers are being completed for him as well to cover this time. If additional time off work is needed, it will be by the recommendations of neuro specialist. - F/U 4 weeks.   Reviewed expectations re: course of current medical issues.  Discussed self-management of symptoms.  Outlined signs and symptoms indicating need for more acute intervention.  Patient verbalized understanding and all questions were answered.  Patient received an After-Visit Summary.    No orders of the defined types were placed in this encounter.  Note is dictated utilizing voice recognition software. Although note has been proof read prior to signing, occasional typographical errors still can be missed. If any questions arise, please do not hesitate to call for verification.   electronically signed by:  Howard Pouch, DO  [place]

## 2017-02-11 NOTE — Patient Instructions (Addendum)
Start gabapentin  Tonight before bed. Increase to every 8 hours as we discussed. If it makes you to tired then please decrease to every 12 hour use.  Continue physical therapy.  Start Miralax 1 cap in 8 ounces daily to soften stools.  I have referred you to a spine specialist given your MRI and symptoms.   We will complete your FMLA and return to employer.   If any emergent symptoms, bladder-bowel problems, then please be seen immediately in the ED.  Follow up in 4 weeks.

## 2017-02-12 ENCOUNTER — Ambulatory Visit: Payer: BLUE CROSS/BLUE SHIELD | Admitting: Family Medicine

## 2017-02-12 ENCOUNTER — Encounter: Payer: Self-pay | Admitting: Physical Therapy

## 2017-02-12 ENCOUNTER — Ambulatory Visit: Payer: BLUE CROSS/BLUE SHIELD | Admitting: Physical Therapy

## 2017-02-12 DIAGNOSIS — M5442 Lumbago with sciatica, left side: Secondary | ICD-10-CM | POA: Diagnosis not present

## 2017-02-12 DIAGNOSIS — M5136 Other intervertebral disc degeneration, lumbar region: Secondary | ICD-10-CM | POA: Insufficient documentation

## 2017-02-12 DIAGNOSIS — M4310 Spondylolisthesis, site unspecified: Secondary | ICD-10-CM | POA: Insufficient documentation

## 2017-02-12 DIAGNOSIS — M5416 Radiculopathy, lumbar region: Secondary | ICD-10-CM | POA: Insufficient documentation

## 2017-02-12 DIAGNOSIS — M5126 Other intervertebral disc displacement, lumbar region: Secondary | ICD-10-CM | POA: Insufficient documentation

## 2017-02-12 DIAGNOSIS — M48062 Spinal stenosis, lumbar region with neurogenic claudication: Secondary | ICD-10-CM | POA: Insufficient documentation

## 2017-02-12 DIAGNOSIS — R293 Abnormal posture: Secondary | ICD-10-CM

## 2017-02-12 DIAGNOSIS — M48061 Spinal stenosis, lumbar region without neurogenic claudication: Secondary | ICD-10-CM | POA: Insufficient documentation

## 2017-02-12 DIAGNOSIS — M47816 Spondylosis without myelopathy or radiculopathy, lumbar region: Secondary | ICD-10-CM | POA: Insufficient documentation

## 2017-02-12 NOTE — Therapy (Signed)
[facility] [place], [place], [address] Phone: [phone]   Fax:  [phone]  Physical Therapy Treatment  Patient Details  Name: Matthew Sims MRN: [id number] Date of Birth: February 02, 1961 Referring Provider: Ricardo Jericho MD   Encounter Date: 02/12/2017  PT End of Session - 02/12/17 1434    Visit Number  2    Number of Visits  12    Date for PT Re-Evaluation  03/25/17    PT Start Time  1430    PT Stop Time  1530    PT Time Calculation (min)  60 min    Activity Tolerance  Patient tolerated treatment well    Behavior During Therapy  [facility] for tasks assessed/performed       Past Medical History:  Diagnosis Date  . Alcoholism ([place])    Abstinent since 2012.  Marland Kitchen Chicken pox   . Chronic low back pain 01/2017   Acute severe episode of LBP w/radiculopathy 01/2017:  MRI showed DDD/spondylosis w/mild-mod spinal stenosis  . Colon polyps   . COPD (chronic obstructive pulmonary disease) (HCC)    no inhaler    Past Surgical History:  Procedure Laterality Date  . HERNIA REPAIR  8527   umbilical hernia  . INGUINAL HERNIA REPAIR Right 03/21/2016   Procedure: RIGHT INGUINAL HERNIORRHAPY WITH MESH;  Surgeon: Aviva Signs, MD;  Location: [facility];  Service: General;  Laterality: Right;  . ROTATOR CUFF REPAIR Right 2000, 2010  . rotator cuff tear Left 2001  . SHOULDER SURGERY    . TONSILLECTOMY  1966    There were no vitals filed for this visit.  Subjective Assessment - 02/12/17 1431    Subjective  Reports that his MD told him he was a mess and prescribed him Gabapentin. Reports that last night upon laying down and waking up he had pain in R low back. Reports that he was able to go down stairs but has difficulty ascending stairs. Reports that he is able to urinate better today.     Pertinent History  H/o left sided low back pain.    Limitations  Standing;Walking    How long can you stand comfortably?  10 minutes on concrete.    How  long can you walk comfortably?  Short distances before pain rises.    Diagnostic tests  MRI.Marland KitchenPlease view under "Imaging" tab.    Patient Stated Goals  Get out of pain.    Currently in Pain?  Yes    Pain Score  4     Pain Location  Back    Pain Orientation  Right;Lower    Pain Descriptors / Indicators  Constant;Tightness;Discomfort    Pain Type  Acute pain    Pain Onset  1 to 4 weeks ago    Pain Frequency  Constant         [facility] PT Assessment - 02/12/17 0001      Assessment   Medical Diagnosis  Acute left-sided low back pain with sciatica.    Onset Date/Surgical Date  02/01/17    Next MD Visit  03/06/2016      Precautions   Precautions  None      Restrictions   Weight Bearing Restrictions  No                  [facility] Adult PT Treatment/Exercise - 02/12/17 0001      Exercises   Exercises  Lumbar      Lumbar Exercises: Stretches   Single Knee  to Chest Stretch  3 reps;30 seconds RLE    Piriformis Stretch  2 reps;60 seconds RLE      Lumbar Exercises: Aerobic   Stationary Bike  NuStep L5 x10 min      Lumbar Exercises: Supine   Ab Set  20 reps;5 seconds    Bent Knee Raise  Other (comment) x30 reps      Knee/Hip Exercises: Aerobic   Nustep  --      Modalities   Modalities  Electrical Stimulation;Moist Heat      Moist Heat Therapy   Number Minutes Moist Heat  15 Minutes    Moist Heat Location  Lumbar Spine      Electrical Stimulation   Electrical Stimulation Location  B low back    Electrical Stimulation Action  IFC    Electrical Stimulation Parameters  1-10 hz x15 min    Electrical Stimulation Goals  Pain             PT Education - 02/12/17 1536    Education provided  Yes    Education Details  HEP- SKTC, Piriformis stretch, ab set, march    Person(s) Educated  Patient    Methods  Explanation;Handout    Comprehension  Verbalized understanding          PT Long Term Goals - 02/11/17 1221      PT LONG TERM GOAL #1   Title  Independent  with a HEP.    Time  6    Period  Weeks    Status  New      PT LONG TERM GOAL #2   Title  Stand 20 minutes wiht pain not > 3/10.    Time  6    Period  Weeks    Status  New      PT LONG TERM GOAL #3   Title  Eliminate left LE symptoms.    Time  6    Period  Weeks    Status  New      PT LONG TERM GOAL #4   Title  Perform ADL's with pain not > 3/10.    Time  6    Period  Weeks    Status  New            Plan - 02/12/17 1514    Clinical Impression Statement  Patient presented in clinic with continued reports of pain in low back but especially R low back. Patient very guarded with overall mobility but very guarded with bed mobility during exercises. Patient guided through low back stretches and core activation exercises with education regarding proper core activation. Patient reported more pain with lying supine and initially with L SL for modality donning. Normal modalities response noted following removal of the modalities. Patient provided HEP for stretching and core strengthening at home with patient verbalizing understanding of instructions.     Rehab Potential  Good    PT Frequency  2x / week    PT Duration  6 weeks    PT Treatment/Interventions  ADLs/Self Care Home Management;Cryotherapy;Electrical Stimulation;Moist Heat;Ultrasound;Therapeutic exercise;Therapeutic activities;Patient/family education;Manual techniques;Dry needling    PT Next Visit Plan  Modalites to low back as needed.  Progress into a core exercise program.    Consulted and Agree with Plan of Care  Patient       Patient will benefit from skilled therapeutic intervention in order to improve the following deficits and impairments:  Decreased activity tolerance, Decreased range of motion, Postural dysfunction, Pain  Visit Diagnosis: Acute left-sided low back pain with left-sided sciatica  Abnormal posture     Problem List Patient Active Problem List   Diagnosis Date Noted  . Protrusion of lumbar  intervertebral disc 02/12/2017  . Facet hypertrophy of lumbar region 02/12/2017  . Spinal stenosis of lumbar region with neurogenic claudication 02/12/2017  . Retrolisthesis of vertebrae 02/12/2017  . Annular tear of lumbar disc 02/12/2017  . Lumbar foraminal stenosis 02/12/2017  . Lumbar back pain with radiculopathy affecting left lower extremity 02/12/2017  . BMI 33.0-33.9,adult 11/17/2016  . History of alcohol abuse 11/16/2014  . Tobacco abuse 11/16/2014    Standley Brooking, PTA 02/12/2017, 5:51 PM  [facility] [address] [place], [place], [address] Phone: [phone]   Fax:  [phone]  Name: Matthew Sims MRN: [id number] Date of Birth: Mar 08, 1960

## 2017-02-12 NOTE — Patient Instructions (Addendum)
Knee-to-Chest Stretch: Unilateral    With hand behind right knee, pull knee in to chest until a comfortable stretch is felt in lower back and buttocks. Keep back relaxed. Hold _30___ seconds. Repeat __3__ times per set. Do ____ sets per session. Do __2-3__ sessions per day.  http://orth.exer.us/126   Copyright  VHI. All rights reserved.  Piriformis (Supine)    Cross legs, right on top. Gently pull right knee toward chest until stretch is felt in buttock/hip of top leg. Hold _30___ seconds. Repeat __3__ times per set. Do ____ sets per session. Do __2-3__ sessions per day.  http://orth.exer.us/676   Copyright  VHI. All rights reserved.  Isometric Abdominal    Lying on back with knees bent, tighten stomach by pressing elbows down. Hold __5__ seconds. Repeat __10__ times per set. Do __2__ sets per session. Do _2-3___ sessions per day.  http://orth.exer.us/1086   Copyright  VHI. All rights reserved.  Unilateral Isometric Hip Flexion    Lying on your back and hold core tight and then march your legs in place. Repeat _10___ times per set. Do __2__ sets per session. Do __2-3__ sessions per day.  http://orth.exer.us/1098   Copyright  VHI. All rights reserved.

## 2017-02-16 ENCOUNTER — Ambulatory Visit: Payer: BLUE CROSS/BLUE SHIELD | Admitting: Physical Therapy

## 2017-02-16 ENCOUNTER — Encounter: Payer: Self-pay | Admitting: Physical Therapy

## 2017-02-16 DIAGNOSIS — M5442 Lumbago with sciatica, left side: Secondary | ICD-10-CM | POA: Diagnosis not present

## 2017-02-16 DIAGNOSIS — R293 Abnormal posture: Secondary | ICD-10-CM

## 2017-02-16 NOTE — Therapy (Signed)
[facility] [place], [place], [address] Phone: [phone]   Fax:  [phone]  Physical Therapy Treatment  Patient Details  Name: Matthew Sims MRN: [id number] Date of Birth: [birth year] Referring Provider: Ricardo Jericho MD   Encounter Date: 02/16/2017  PT End of Session - 02/16/17 1211    Visit Number  3    Number of Visits  12    Date for PT Re-Evaluation  03/25/17    PT Start Time  1200    PT Stop Time  1245    PT Time Calculation (min)  45 min    Activity Tolerance  Patient tolerated treatment well    Behavior During Therapy  [facility] for tasks assessed/performed       Past Medical History:  Diagnosis Date  . Alcoholism ([place])    Abstinent since 2012.  Marland Kitchen Chicken pox   . Chronic low back pain 01/2017   Acute severe episode of LBP w/radiculopathy 01/2017:  MRI showed DDD/spondylosis w/mild-mod spinal stenosis  . Colon polyps   . COPD (chronic obstructive pulmonary disease) (HCC)    no inhaler    Past Surgical History:  Procedure Laterality Date  . HERNIA REPAIR  8099   umbilical hernia  . INGUINAL HERNIA REPAIR Right 03/21/2016   Procedure: RIGHT INGUINAL HERNIORRHAPY WITH MESH;  Surgeon: Aviva Signs, MD;  Location: [facility];  Service: General;  Laterality: Right;  . ROTATOR CUFF REPAIR Right 2000, 2010  . rotator cuff tear Left 2001  . SHOULDER SURGERY    . TONSILLECTOMY  1966    There were no vitals filed for this visit.  Subjective Assessment - 02/16/17 1209    Subjective  Pt reporting he is waiting a neurosurgery consult appointment. Pt reporting difficutly walking and reporting pain of 5/10 with sitting and worse with walking.     Pertinent History  H/o left sided low back pain.    How long can you stand comfortably?  10 minutes on concrete.    How long can you walk comfortably?  Short distances before pain rises.    Diagnostic tests  MRI.Marland KitchenPlease view under "Imaging" tab.    Patient Stated Goals  Get out  of pain.    Currently in Pain?  Yes    Pain Score  5     Pain Location  Back    Pain Orientation  Lower    Pain Descriptors / Indicators  Constant    Pain Type  Acute pain    Pain Onset  More than a month ago    Pain Frequency  Constant         [facility] PT Assessment - 02/16/17 0001      Assessment   Medical Diagnosis  Acute left-sided low back pain with sciatica.    Onset Date/Surgical Date  02/01/17    Next MD Visit  03/06/2016      Precautions   Precautions  None      Restrictions   Weight Bearing Restrictions  No                  [facility] Adult PT Treatment/Exercise - 02/16/17 0001      Exercises   Exercises  Lumbar      Lumbar Exercises: Stretches   Single Knee to Chest Stretch  3 reps;30 seconds RLE    Piriformis Stretch  2 reps;60 seconds RLE      Lumbar Exercises: Aerobic   Stationary Bike  NuStep L5 x10 min  Lumbar Exercises: Supine   Ab Set  20 reps;5 seconds    Clam  10 reps;3 seconds    Bent Knee Raise  Other (comment) x30 reps    Bridge  10 reps    Other Supine Lumbar Exercises  PPT x 10 holding 5 seconds each      Modalities   Modalities  Electrical Stimulation;Moist Heat      Moist Heat Therapy   Number Minutes Moist Heat  15 Minutes    Moist Heat Location  Lumbar Spine      Electrical Stimulation   Electrical Stimulation Location  B low back    Electrical Stimulation Action  IFC    Electrical Stimulation Parameters  1-10 Hz x 15 minutes    Electrical Stimulation Goals  Pain             PT Education - 02/16/17 1210    Education Details  reviewed HEP    Person(s) Educated  Patient    Methods  Explanation    Comprehension  Verbalized understanding          PT Long Term Goals - 02/16/17 1214      PT LONG TERM GOAL #1   Title  Independent with a HEP.    Period  Weeks    Status  New      PT LONG TERM GOAL #2   Title  Stand 20 minutes wiht pain not > 3/10.    Time  6    Period  Weeks    Status  New      PT  LONG TERM GOAL #3   Title  Eliminate left LE symptoms.    Time  6    Period  Weeks    Status  New      PT LONG TERM GOAL #4   Title  Perform ADL's with pain not > 3/10.    Time  6    Period  Weeks    Status  New            Plan - 02/16/17 1211    Clinical Impression Statement  Pt presenting with low back pain with can vary depending on the activity and day. Pt wants to return to work. Pt very guarded with all mobility. We reviewed pt's HEP and core activation exercises. Pt reporting less pain at end of session. Continue skilled PT.     Rehab Potential  Good    PT Frequency  2x / week    PT Duration  6 weeks    PT Treatment/Interventions  ADLs/Self Care Home Management;Cryotherapy;Electrical Stimulation;Moist Heat;Ultrasound;Therapeutic exercise;Therapeutic activities;Patient/family education;Manual techniques;Dry needling    PT Next Visit Plan  Modalites to low back as needed.  Progress into a core exercise program.    PT Home Exercise Plan  SKTC, ab sets, PPT, bridges, supine marching    Consulted and Agree with Plan of Care  Patient       Patient will benefit from skilled therapeutic intervention in order to improve the following deficits and impairments:  Decreased activity tolerance, Decreased range of motion, Postural dysfunction, Pain  Visit Diagnosis: Acute left-sided low back pain with left-sided sciatica  Abnormal posture     Problem List Patient Active Problem List   Diagnosis Date Noted  . Protrusion of lumbar intervertebral disc 02/12/2017  . Facet hypertrophy of lumbar region 02/12/2017  . Spinal stenosis of lumbar region with neurogenic claudication 02/12/2017  . Retrolisthesis of vertebrae 02/12/2017  . Annular tear of  lumbar disc 02/12/2017  . Lumbar foraminal stenosis 02/12/2017  . Lumbar back pain with radiculopathy affecting left lower extremity 02/12/2017  . BMI 33.0-33.9,adult 11/17/2016  . History of alcohol abuse 11/16/2014  . Tobacco abuse  11/16/2014    Oretha Caprice, MPT 02/16/2017, 12:18 PM  [facility] [place], [place], [address] Phone: [phone]   Fax:  [phone]  Name: Neev Mcmains MRN: [id number] Date of Birth: [birth year]

## 2017-02-19 ENCOUNTER — Encounter: Payer: Self-pay | Admitting: Physical Therapy

## 2017-02-19 ENCOUNTER — Ambulatory Visit: Payer: BLUE CROSS/BLUE SHIELD | Admitting: Physical Therapy

## 2017-02-19 DIAGNOSIS — M5442 Lumbago with sciatica, left side: Secondary | ICD-10-CM

## 2017-02-19 DIAGNOSIS — R293 Abnormal posture: Secondary | ICD-10-CM

## 2017-02-19 NOTE — Therapy (Addendum)
[facility] [place], [place], [address] Phone: [phone]   Fax:  [phone]  Physical Therapy Treatment  Patient Details  Name: Matthew Sims MRN: [id number] Date of Birth: 1960-10-18 Referring Provider: Ricardo Jericho MD   Encounter Date: 02/19/2017  PT End of Session - 02/19/17 1348    Visit Number  4    Number of Visits  12    Date for PT Re-Evaluation  03/25/17    PT Start Time  1301    PT Stop Time  1357    PT Time Calculation (min)  56 min    Activity Tolerance  Patient tolerated treatment well    Behavior During Therapy  [facility] for tasks assessed/performed       Past Medical History:  Diagnosis Date  . Alcoholism ([place])    Abstinent since 2012.  Marland Kitchen Chicken pox   . Chronic low back pain 01/2017   Acute severe episode of LBP w/radiculopathy 01/2017:  MRI showed DDD/spondylosis w/mild-mod spinal stenosis  . Colon polyps   . COPD (chronic obstructive pulmonary disease) (HCC)    no inhaler    Past Surgical History:  Procedure Laterality Date  . HERNIA REPAIR  6962   umbilical hernia  . INGUINAL HERNIA REPAIR Right 03/21/2016   Procedure: RIGHT INGUINAL HERNIORRHAPY WITH MESH;  Surgeon: Aviva Signs, MD;  Location: [facility];  Service: General;  Laterality: Right;  . ROTATOR CUFF REPAIR Right 2000, 2010  . rotator cuff tear Left 2001  . SHOULDER SURGERY    . TONSILLECTOMY  1966    There were no vitals filed for this visit.  Subjective Assessment - 02/19/17 1306    Subjective  Patient has reported no long time relief    Pertinent History  H/o left sided low back pain.    Limitations  Standing;Walking    How long can you stand comfortably?  10 minutes on concrete.    How long can you walk comfortably?  Short distances before pain rises.    Diagnostic tests  MRI.Marland KitchenPlease view under "Imaging" tab.    Patient Stated Goals  Get out of pain.    Currently in Pain?  Yes    Pain Score  5     Pain Location  Back     Pain Orientation  Lower    Pain Type  Acute pain    Pain Onset  More than a month ago    Pain Frequency  Constant    Aggravating Factors   any prolong standing or walking    Pain Relieving Factors  at rest                      [facility] Adult PT Treatment/Exercise - 02/19/17 0001      Lumbar Exercises: Aerobic   Stationary Bike  NuStep L4 x10 min UE/LE      Lumbar Exercises: Standing   Other Standing Lumbar Exercises  pink XTS for lat pull x20  (Pended)       Lumbar Exercises: Seated   Other Seated Lumbar Exercises  scap retractions x20 pink XTS  (Pended)       Lumbar Exercises: Supine   Ab Set  20 reps;5 seconds    Glut Set  20 reps;5 seconds    Bent Knee Raise  3 seconds 2x20    Bridge  20 reps    Straight Leg Raise  20 reps;3 seconds right LE only      Moist Heat  Therapy   Number Minutes Moist Heat  15 Minutes    Moist Heat Location  Lumbar Spine      Electrical Stimulation   Electrical Stimulation Location  B low back    Electrical Stimulation Action  IFC    Electrical Stimulation Parameters  1-_0  x28mn    Electrical Stimulation Goals  Pain             PT Education - 02/19/17 1359    Education provided  Yes    Education Details  posture awareness techniques in all positions, lifting, bending, ADL's    Person(s) Educated  Patient    Methods  Explanation;Demonstration;Verbal cues    Comprehension  Verbalized understanding;Returned demonstration;Verbal cues required          PT Long Term Goals - 02/19/17 1349      PT LONG TERM GOAL #1   Title  Independent with a HEP.    Time  6    Period  Weeks    Status  Achieved      PT LONG TERM GOAL #2   Title  Stand 20 minutes wiht pain not > 3/10.    Time  6    Period  Weeks    Status  On-going      PT LONG TERM GOAL #3   Title  Eliminate left LE symptoms.    Time  6    Period  Weeks    Status  On-going      PT LONG TERM GOAL #4   Title  Perform ADL's with pain not > 3/10.    Time  6     Period  Weeks    Status  On-going            Plan - 02/19/17 1350    Clinical Impression Statement  Patient tolerated treatment well today. Educated patient on posture awareness techniques with posture, lifting, bending and ADL's as well as core activation and strengthening. Patient reported ongoing discomfort in low back esp with any prolong walking. Patient understands HEP and posture importance. LTG #1 met others ongoing. Patient has appt with neuro surgen next week.     Rehab Potential  Good    PT Frequency  2x / week    PT Duration  6 weeks    PT Treatment/Interventions  ADLs/Self Care Home Management;Cryotherapy;Electrical Stimulation;Moist Heat;Ultrasound;Therapeutic exercise;Therapeutic activities;Patient/family education;Manual techniques;Dry needling    PT Next Visit Plan  Modalites to low back as needed.  Progress into a core exercise program.    Consulted and Agree with Plan of Care  Patient       Patient will benefit from skilled therapeutic intervention in order to improve the following deficits and impairments:  Decreased activity tolerance, Decreased range of motion, Postural dysfunction, Pain  Visit Diagnosis: Acute left-sided low back pain with left-sided sciatica  Abnormal posture     Problem List Patient Active Problem List   Diagnosis Date Noted  . Protrusion of lumbar intervertebral disc 02/12/2017  . Facet hypertrophy of lumbar region 02/12/2017  . Spinal stenosis of lumbar region with neurogenic claudication 02/12/2017  . Retrolisthesis of vertebrae 02/12/2017  . Annular tear of lumbar disc 02/12/2017  . Lumbar foraminal stenosis 02/12/2017  . Lumbar back pain with radiculopathy affecting left lower extremity 02/12/2017  . BMI 33.0-33.9,adult 11/17/2016  . History of alcohol abuse 11/16/2014  . Tobacco abuse 11/16/2014    Rhet Rorke P, PTA 02/19/2017, 2:02 PM  [facility]  [place], [place], [address] Phone: [phone]   Fax:  [phone]  Name: Matthew Sims MRN: [id number] Date of Birth: [birth year]  PHYSICAL THERAPY DISCHARGE SUMMARY  Visits from Start of Care: 4.  Current functional level related to goals / functional outcomes: See above.   Remaining deficits: See below.   Education / Equipment: HEP. Plan: Patient agrees to discharge.  Patient goals were not met. Patient is being discharged due to not returning since the last visit.  ?????         Mali Applegate MPT

## 2017-03-03 ENCOUNTER — Telehealth: Payer: Self-pay | Admitting: Family Medicine

## 2017-03-03 NOTE — Telephone Encounter (Addendum)
In regards to patient's phone note. He does not need to follow-up here on January 25.  You will need to get the extension on his work excuse from his neurologist, he has already established with them (had appt and scheduled for injections), so that should not be an issue.

## 2017-03-03 NOTE — Telephone Encounter (Signed)
Patient wanting to know if he has to keep his appt here on 03/06/17 since he has an appt with Neuro on 03/17/17. He is requesting we extend his work excuse since it only covers him through 03/09/17. Please advise

## 2017-03-03 NOTE — Telephone Encounter (Signed)
Copied from [place]. Topic: Quick Communication - See Telephone Encounter >> Mar 03, 2017  2:00 PM Vernona Rieger wrote: CRM for notification. See Telephone encounter for:   03/03/17.  Patient said he will be seeing his neuro dr on feb 5th for a steroid shot, he wants to know does he still need to really come in and see [facility] on 1/25? Call back is [phone] If he doesn't have to come, could Dr Raoul Pitch write him a slip to be out of work. He asked the neuro dr and he told him that he needs to get it from his PCP.

## 2017-03-04 NOTE — Telephone Encounter (Signed)
Spoke with patient reviewed information. Patient verbalized understanding 

## 2017-03-06 ENCOUNTER — Ambulatory Visit: Payer: BLUE CROSS/BLUE SHIELD | Admitting: Family Medicine

## 2017-11-18 ENCOUNTER — Encounter: Payer: BLUE CROSS/BLUE SHIELD | Admitting: Family Medicine

## 2018-04-09 HISTORY — PX: SPINAL CORD STIMULATOR INSERTION: SHX5378

## 2018-04-11 ENCOUNTER — Emergency Department (HOSPITAL_COMMUNITY): Payer: BLUE CROSS/BLUE SHIELD

## 2018-04-11 ENCOUNTER — Other Ambulatory Visit: Payer: Self-pay

## 2018-04-11 ENCOUNTER — Emergency Department (HOSPITAL_COMMUNITY)
Admission: EM | Admit: 2018-04-11 | Discharge: 2018-04-12 | Disposition: A | Discharge status: Home / Self Care | Payer: BLUE CROSS/BLUE SHIELD | Attending: Emergency Medicine | Admitting: Emergency Medicine

## 2018-04-11 ENCOUNTER — Encounter (HOSPITAL_COMMUNITY): Payer: Self-pay | Admitting: *Deleted

## 2018-04-11 DIAGNOSIS — F1020 Alcohol dependence, uncomplicated: Secondary | ICD-10-CM | POA: Diagnosis not present

## 2018-04-11 DIAGNOSIS — F17210 Nicotine dependence, cigarettes, uncomplicated: Secondary | ICD-10-CM | POA: Diagnosis not present

## 2018-04-11 DIAGNOSIS — R1010 Upper abdominal pain, unspecified: Secondary | ICD-10-CM | POA: Diagnosis present

## 2018-04-11 DIAGNOSIS — J449 Chronic obstructive pulmonary disease, unspecified: Secondary | ICD-10-CM | POA: Insufficient documentation

## 2018-04-11 DIAGNOSIS — R1084 Generalized abdominal pain: Secondary | ICD-10-CM | POA: Insufficient documentation

## 2018-04-11 LAB — CBC WITH DIFFERENTIAL/PLATELET
Abs Immature Granulocytes: 0.04 10*3/uL (ref 0.00–0.07)
Basophils Absolute: 0 10*3/uL (ref 0.0–0.1)
Basophils Relative: 0 %
Eosinophils Absolute: 0.1 10*3/uL (ref 0.0–0.5)
Eosinophils Relative: 1 %
HCT: 46.1 % (ref 39.0–52.0)
Hemoglobin: 14.6 g/dL (ref 13.0–17.0)
Immature Granulocytes: 0 %
Lymphocytes Relative: 18 %
Lymphs Abs: 1.7 10*3/uL (ref 0.7–4.0)
MCH: 29.6 pg (ref 26.0–34.0)
MCHC: 31.7 g/dL (ref 30.0–36.0)
MCV: 93.3 fL (ref 80.0–100.0)
Monocytes Absolute: 0.9 10*3/uL (ref 0.1–1.0)
Monocytes Relative: 9 %
Neutro Abs: 6.5 10*3/uL (ref 1.7–7.7)
Neutrophils Relative %: 72 %
Platelets: 280 10*3/uL (ref 150–400)
RBC: 4.94 MIL/uL (ref 4.22–5.81)
RDW: 13.4 % (ref 11.5–15.5)
WBC: 9.3 10*3/uL (ref 4.0–10.5)
nRBC: 0 % (ref 0.0–0.2)

## 2018-04-11 LAB — COMPREHENSIVE METABOLIC PANEL
ALT: 28 U/L (ref 0–44)
AST: 25 U/L (ref 15–41)
Albumin: 3.9 g/dL (ref 3.5–5.0)
Alkaline Phosphatase: 62 U/L (ref 38–126)
Anion gap: 8 (ref 5–15)
BUN: 15 mg/dL (ref 6–20)
CO2: 27 mmol/L (ref 22–32)
Calcium: 9.2 mg/dL (ref 8.9–10.3)
Chloride: 103 mmol/L (ref 98–111)
Creatinine, Ser: 0.98 mg/dL (ref 0.61–1.24)
GFR calc Af Amer: >60 mL/min (ref >60)
GFR calc non Af Amer: >60 mL/min (ref >60)
Glucose, Bld: 115 mg/dL — ABNORMAL HIGH (ref 70–99)
Potassium: 4.3 mmol/L (ref 3.5–5.1)
Sodium: 138 mmol/L (ref 135–145)
Total Bilirubin: 0.8 mg/dL (ref 0.3–1.2)
Total Protein: 7.1 g/dL (ref 6.5–8.1)

## 2018-04-11 LAB — LIPASE, BLOOD: Lipase: 26 U/L (ref 11–51)

## 2018-04-11 MED ORDER — ONDANSETRON HCL 4 MG/2ML IJ SOLN
4.0000 mg | Freq: Once | INTRAMUSCULAR | Status: AC
  Administered 2018-04-11: 4 mg via INTRAVENOUS
  Filled 2018-04-11: qty 2

## 2018-04-11 MED ORDER — HYDROMORPHONE HCL 1 MG/ML IJ SOLN
0.5000 mg | INTRAMUSCULAR | Status: DC | PRN
  Administered 2018-04-11 – 2018-04-12 (×2): 0.5 mg via INTRAVENOUS
  Filled 2018-04-11 (×2): qty 1

## 2018-04-11 MED ORDER — SODIUM CHLORIDE 0.9 % IV SOLN: INTRAVENOUS | Status: DC

## 2018-04-11 MED ORDER — SODIUM CHLORIDE 0.9 % IV BOLUS
1000.0000 mL | Freq: Once | INTRAVENOUS | Status: AC
  Administered 2018-04-11: 1000 mL via INTRAVENOUS

## 2018-04-11 NOTE — ED Notes (Signed)
Patient states pain is only present in left half of abdomen. He states it feels like pins and needles. Refuses to wear shirt due to pain, stating anything that touches his skin increases pain sensation. Pt is leaning over the mayo stand and states that it is the most comfortable position at this time.

## 2018-04-11 NOTE — ED Provider Notes (Signed)
[facility] EMERGENCY DEPARTMENT Provider Note   CSN: [id number] Arrival date & time: 04/11/18  2126    History   Chief Complaint Chief Complaint  Patient presents with  . Abdominal Pain    HPI Matthew Sims is a 58 y.o. male.     HPI Patient presents to the emergency room for evaluation abdominal pain.  Patient states he has a history of chronic back pain.  Recently had a spinal cord stimulator placed.  This was done on Friday.  Patient states he had some pain and soreness in the area of his surgical incisions.  He was not too concerned about this.  Patient states however over the last day or so he has had increasing severe pain throughout his abdomen.  Patient states the pain is in his bilateral upper and lower abdomen.  It is very uncomfortable for him to try to lift his legs he also feels better leaning forward.  Patient states any touch whatsoever to his abdomen is very painful.  It is mostly a pins and needle sensation.  He denies any fevers.  No vomiting.  No diarrhea.  No dysuria. Past Medical History:  Diagnosis Date  . Alcoholism ([place])    Abstinent since 2012.  Marland Kitchen Chicken pox   . Chronic low back pain 01/2017   Acute severe episode of LBP w/radiculopathy 01/2017:  MRI showed DDD/spondylosis w/mild-mod spinal stenosis  . Colon polyps   . COPD (chronic obstructive pulmonary disease) ([facility])    no inhaler    Patient Active Problem List   Diagnosis Date Noted  . Protrusion of lumbar intervertebral disc 02/12/2017  . Facet hypertrophy of lumbar region 02/12/2017  . Spinal stenosis of lumbar region with neurogenic claudication 02/12/2017  . Retrolisthesis of vertebrae 02/12/2017  . Annular tear of lumbar disc 02/12/2017  . Lumbar foraminal stenosis 02/12/2017  . Lumbar back pain with radiculopathy affecting left lower extremity 02/12/2017  . BMI 33.0-33.9,adult 11/17/2016  . History of alcohol abuse 11/16/2014  . Tobacco abuse 11/16/2014    Past Surgical History:    Procedure Laterality Date  . HERNIA REPAIR  8115   umbilical hernia  . INGUINAL HERNIA REPAIR Right 03/21/2016   Procedure: RIGHT INGUINAL HERNIORRHAPY WITH MESH;  Surgeon: Aviva Signs, MD;  Location: [facility];  Service: General;  Laterality: Right;  . ROTATOR CUFF REPAIR Right 2000, 2010  . rotator cuff tear Left 2001  . SHOULDER SURGERY    . SPINAL CORD STIMULATOR INSERTION    . TONSILLECTOMY  1966        Home Medications    Prior to Admission medications   Medication Sig Start Date End Date Taking? Authorizing Provider  bisacodyl (EX-LAX ULTRA) 5 MG EC tablet Take 5 mg by mouth daily as needed for moderate constipation.   Yes [provider]  docusate sodium (COLACE) 100 MG capsule Take 100 mg by mouth daily as needed for mild constipation.   Yes [provider]  EPINEPHrine 0.3 mg/0.3 mL IJ SOAJ injection Inject 0.3 mLs (0.3 mg total) into the muscle once. 08/28/15  Yes Kuneff, Renee A, DO  HYDROcodone-Acetaminophen 5-300 MG TABS Take 1 tablet by mouth every 6 (six) hours as needed. for pain 04/09/18  Yes [provider]  ibuprofen (ADVIL,MOTRIN) 200 MG tablet Take 800 mg by mouth every 6 (six) hours as needed for mild pain or moderate pain.   Yes [provider]    Family History Family History  Problem Relation Age of Onset  .  Breast cancer Mother   . COPD Mother   . Alcohol abuse Maternal Grandfather   . Colon cancer Neg Hx   . Colon polyps Neg Hx   . Esophageal cancer Neg Hx   . Rectal cancer Neg Hx   . Stomach cancer Neg Hx     Social History Social History   Tobacco Use  . Smoking status: Current Every Day Smoker    Packs/day: 1.00    Years: 43.00    Pack years: 43.00    Types: Cigarettes  . Smokeless tobacco: Never Used  Substance Use Topics  . Alcohol use: No    Alcohol/week: 0.0 standard drinks    Comment: sober since 2012  . Drug use: No     Allergies   Bee venom   Review of Systems Review of Systems  All  other systems reviewed and are negative.    Physical Exam Updated Vital Signs BP 129/88 (BP Location: Right Arm)   Pulse 72   Temp 97.9 F (36.6 C) (Oral)   Resp 18   Ht 1.651 m (5\' 5" )   Wt 99.8 kg   SpO2 99%   BMI 36.61 kg/m   Physical Exam Vitals signs and nursing note reviewed.  Constitutional:      General: He is not in acute distress.    Appearance: He is well-developed.  HENT:     Head: Normocephalic and atraumatic.     Right Ear: External ear normal.     Left Ear: External ear normal.  Eyes:     General: No scleral icterus.       Right eye: No discharge.        Left eye: No discharge.     Conjunctiva/sclera: Conjunctivae normal.  Neck:     Musculoskeletal: Neck supple.     Trachea: No tracheal deviation.  Cardiovascular:     Rate and Rhythm: Normal rate and regular rhythm.  Pulmonary:     Effort: Pulmonary effort is normal. No respiratory distress.     Breath sounds: Normal breath sounds. No stridor. No wheezing or rales.  Abdominal:     General: Bowel sounds are normal. There is no distension.     Palpations: Abdomen is soft.     Tenderness: There is generalized abdominal tenderness. There is guarding. There is no rebound.     Hernia: No hernia is present.  Musculoskeletal:        General: No tenderness.     Comments: Status post spinal cord stimulator placement, wounds without erythema or drainage on the back  Skin:    General: Skin is warm and dry.     Findings: No rash.  Neurological:     Mental Status: He is alert.     Cranial Nerves: No cranial nerve deficit (no facial droop, extraocular movements intact, no slurred speech).     Sensory: No sensory deficit.     Motor: No abnormal muscle tone or seizure activity.     Coordination: Coordination normal.      ED Treatments / Results  Labs (all labs ordered are listed, but only abnormal results are displayed) Labs Reviewed  COMPREHENSIVE METABOLIC PANEL - Abnormal; Notable for the following  components:      Result Value   Glucose, Bld 115 (*)    All other components within normal limits  LIPASE, BLOOD  CBC WITH DIFFERENTIAL/PLATELET  URINALYSIS, ROUTINE W REFLEX MICROSCOPIC    EKG None  Radiology Dg Chest Portable 1 View  Result Date:  04/11/2018 CLINICAL DATA:  Abdominal pain. Spinal cord stimulator inserted 2 days ago. EXAM: PORTABLE CHEST 1 VIEW COMPARISON:  August 28, 2015 FINDINGS: The distal leads of the spinal cord stimulator identified. No pneumothorax. The heart, hila, and mediastinum are normal. Minimal atelectasis in the lateral left lung base. No other abnormalities. IMPRESSION: No acute abnormalities noted. Minimal atelectasis in the lateral left lung base. Electronically Signed   By: Dorise Bullion III M.D   On: 04/11/2018 23:35    Procedures Procedures (including critical care time)  Medications Ordered in ED Medications  sodium chloride 0.9 % bolus 1,000 mL (1,000 mLs Intravenous New Bag/Given 04/11/18 2313)    And  0.9 %  sodium chloride infusion (has no administration in time range)  HYDROmorphone (DILAUDID) injection 0.5 mg (0.5 mg Intravenous Given 04/11/18 2314)  ondansetron (ZOFRAN) injection 4 mg (4 mg Intravenous Given 04/11/18 2313)     Initial Impression / Assessment and Plan / ED Course  I have reviewed the triage vital signs and the nursing notes.  Pertinent labs & imaging results that were available during my care of the patient were reviewed by me and considered in my medical decision making (see chart for details).  Clinical Course as of Apr 12 10  Mon Apr 12, 2018  0012 No acute abnormalities noted on labs.  Urinalysis pending   [JK]    Clinical Course User Index [JK] Dorie Rank, MD  Pt presents with diffuse abd pain after recent spinal stimulator placement.  Pain does seem neuropathic but he is diffusely tender.   Will check labs and proceed with CT scan.  Dr Roxanne Mins will follow up on reults.  Final Clinical Impressions(s) / ED  Diagnoses  pending   Dorie Rank, MD 04/12/18 [phone]

## 2018-04-11 NOTE — ED Triage Notes (Signed)
Pt c/o generalized abdominal pain; pt states it is extremely tender and feels like a "pins and needles" feeling; pt had a spinal cord stimulator inserted Friday and the abdominal pain started saturday

## 2018-04-12 MED ORDER — HYDROCODONE-ACETAMINOPHEN 5-300 MG PO TABS: 1.0000 | ORAL_TABLET | Freq: Four times a day (QID) | ORAL | 0 refills | Status: DC | PRN

## 2018-04-12 MED ORDER — IOHEXOL 300 MG/ML  SOLN
100.0000 mL | Freq: Once | INTRAMUSCULAR | Status: AC | PRN
  Administered 2018-04-12: 100 mL via INTRAVENOUS

## 2018-04-12 NOTE — ED Provider Notes (Signed)
Care assumed from Dr. Tomi Bamberger, patient being evaluated for abdominal pain which started after implantation of a spinal cord stimulator.  Labs and CT of abdomen and pelvis showed no obvious cause for pain.  He describes very sensitive skin to the point where even blowing across it caused increased pain.  He has been taking hydrocodone-acetaminophen at home without relief.  I suspect pain may be related to the implanted spinal cord stimulator.  He is given a take-home pack of hydrocodone-acetaminophen so that he can double his dose at home to try to get pain relief until he can see his surgeon and his pain management specialist.  Return precautions discussed.  Results for orders placed or performed during the hospital encounter of 04/11/18  Comprehensive metabolic panel  Result Value Ref Range   Sodium 138 135 - 145 mmol/L   Potassium 4.3 3.5 - 5.1 mmol/L   Chloride 103 98 - 111 mmol/L   CO2 27 22 - 32 mmol/L   Glucose, Bld 115 (H) 70 - 99 mg/dL   BUN 15 6 - 20 mg/dL   Creatinine, Ser 0.98 0.61 - 1.24 mg/dL   Calcium 9.2 8.9 - 10.3 mg/dL   Total Protein 7.1 6.5 - 8.1 g/dL   Albumin 3.9 3.5 - 5.0 g/dL   AST 25 15 - 41 U/L   ALT 28 0 - 44 U/L   Alkaline Phosphatase 62 38 - 126 U/L   Total Bilirubin 0.8 0.3 - 1.2 mg/dL   GFR calc non Af Amer >60 >60 mL/min   GFR calc Af Amer >60 >60 mL/min   Anion gap 8 5 - 15  Lipase, blood  Result Value Ref Range   Lipase 26 11 - 51 U/L  CBC WITH DIFFERENTIAL  Result Value Ref Range   WBC 9.3 4.0 - 10.5 K/uL   RBC 4.94 4.22 - 5.81 MIL/uL   Hemoglobin 14.6 13.0 - 17.0 g/dL   HCT 46.1 39.0 - 52.0 %   MCV 93.3 80.0 - 100.0 fL   MCH 29.6 26.0 - 34.0 pg   MCHC 31.7 30.0 - 36.0 g/dL   RDW 13.4 11.5 - 15.5 %   Platelets 280 150 - 400 K/uL   nRBC 0.0 0.0 - 0.2 %   Neutrophils Relative % 72 %   Neutro Abs 6.5 1.7 - 7.7 K/uL   Lymphocytes Relative 18 %   Lymphs Abs 1.7 0.7 - 4.0 K/uL   Monocytes Relative 9 %   Monocytes Absolute 0.9 0.1 - 1.0 K/uL   Eosinophils Relative 1 %   Eosinophils Absolute 0.1 0.0 - 0.5 K/uL   Basophils Relative 0 %   Basophils Absolute 0.0 0.0 - 0.1 K/uL   Immature Granulocytes 0 %   Abs Immature Granulocytes 0.04 0.00 - 0.07 K/uL   Ct Abdomen Pelvis W Contrast  Result Date: 04/12/2018 CLINICAL DATA:  Abdominal pain EXAM: CT ABDOMEN AND PELVIS WITH CONTRAST TECHNIQUE: Multidetector CT imaging of the abdomen and pelvis was performed using the standard protocol following bolus administration of intravenous contrast. CONTRAST:  141mL OMNIPAQUE IOHEXOL 300 MG/ML  SOLN COMPARISON:  03/07/2016, 09/05/2012 FINDINGS: Lower chest: Lung bases demonstrate atelectasis at the lingula. No consolidation or effusion. Heart size is. Hepatobiliary: No focal liver abnormality is seen. No gallstones, gallbladder wall thickening, or biliary dilatation. Pancreas: Unremarkable. No pancreatic ductal dilatation or surrounding inflammatory changes. Spleen: Stable 17 mm hypodensity within the spleen posteriorly. Adrenals/Urinary Tract: Adrenal glands are unremarkable. Kidneys are normal, without renal calculi, focal lesion, or  hydronephrosis. Bladder is unremarkable. Stomach/Bowel: Stomach is within normal limits. Appendix appears normal. No evidence of bowel wall thickening, distention, or inflammatory changes. Large stool in the right colon. Vascular/Lymphatic: No significant vascular findings are present. No enlarged abdominal or pelvic lymph nodes. Reproductive: Prostate is unremarkable. Other: Negative for free air or free fluid Musculoskeletal: Stimulator generator in the right posterior flank soft tissues with ascending leads. Degenerative changes without acute or suspicious abnormality. IMPRESSION: 1. No CT evidence for acute intra-abdominal or pelvic abnormality. 2. Stable 17 mm hypodense lesion in the spleen, felt benign given lack of interval change Electronically Signed   By: Donavan Foil M.D.   On: 04/12/2018 01:29   Dg Chest Portable 1  View  Result Date: 04/11/2018 CLINICAL DATA:  Abdominal pain. Spinal cord stimulator inserted 2 days ago. EXAM: PORTABLE CHEST 1 VIEW COMPARISON:  August 28, 2015 FINDINGS: The distal leads of the spinal cord stimulator identified. No pneumothorax. The heart, hila, and mediastinum are normal. Minimal atelectasis in the lateral left lung base. No other abnormalities. IMPRESSION: No acute abnormalities noted. Minimal atelectasis in the lateral left lung base. Electronically Signed   By: Dorise Bullion III M.D   On: 04/11/2018 23:35   Images viewed by me.    Delora Fuel, MD 34/91/79 [phone]

## 2018-04-12 NOTE — Discharge Instructions (Addendum)
Your labs and CAT scan did not show an obvious cause for your pain.  I suspect it may be related to your stimulator.  Please get in touch with the surgeon to discuss further management.  In the meantime, you can double your dose of hydrocodone-acetaminophen to try to get adequate pain relief at home.  Return to the emergency department if pain gets worse, you start running a fever, or start vomiting.

## 2018-04-13 MED FILL — Hydrocodone-Acetaminophen Tab 5-325 MG: ORAL | Qty: 6 | Status: AC

## 2018-06-25 ENCOUNTER — Encounter: Payer: Self-pay | Admitting: Gastroenterology

## 2019-04-26 ENCOUNTER — Encounter: Payer: Self-pay | Admitting: Family Medicine

## 2019-04-26 ENCOUNTER — Other Ambulatory Visit: Payer: Self-pay

## 2019-04-26 ENCOUNTER — Ambulatory Visit: Payer: Self-pay | Admitting: Family Medicine

## 2019-04-26 VITALS — BP 123/92 | HR 70 | Temp 98.2°F | Resp 18 | Ht 64.25 in | Wt 215.2 lb

## 2019-04-26 DIAGNOSIS — Z131 Encounter for screening for diabetes mellitus: Secondary | ICD-10-CM | POA: Diagnosis not present

## 2019-04-26 DIAGNOSIS — Z79899 Other long term (current) drug therapy: Secondary | ICD-10-CM | POA: Diagnosis not present

## 2019-04-26 DIAGNOSIS — M48061 Spinal stenosis, lumbar region without neurogenic claudication: Secondary | ICD-10-CM

## 2019-04-26 DIAGNOSIS — M5416 Radiculopathy, lumbar region: Secondary | ICD-10-CM | POA: Diagnosis not present

## 2019-04-26 DIAGNOSIS — K635 Polyp of colon: Secondary | ICD-10-CM

## 2019-04-26 DIAGNOSIS — E669 Obesity, unspecified: Secondary | ICD-10-CM | POA: Diagnosis not present

## 2019-04-26 DIAGNOSIS — Z125 Encounter for screening for malignant neoplasm of prostate: Secondary | ICD-10-CM

## 2019-04-26 DIAGNOSIS — Z5320 Procedure and treatment not carried out because of patient's decision for unspecified reasons: Secondary | ICD-10-CM

## 2019-04-26 DIAGNOSIS — M47816 Spondylosis without myelopathy or radiculopathy, lumbar region: Secondary | ICD-10-CM | POA: Diagnosis not present

## 2019-04-26 DIAGNOSIS — Z720 Tobacco use: Secondary | ICD-10-CM

## 2019-04-26 DIAGNOSIS — T7840XA Allergy, unspecified, initial encounter: Secondary | ICD-10-CM

## 2019-04-26 DIAGNOSIS — Z91030 Bee allergy status: Secondary | ICD-10-CM

## 2019-04-26 DIAGNOSIS — M48062 Spinal stenosis, lumbar region with neurogenic claudication: Secondary | ICD-10-CM

## 2019-04-26 DIAGNOSIS — M5126 Other intervertebral disc displacement, lumbar region: Secondary | ICD-10-CM

## 2019-04-26 DIAGNOSIS — Z0000 Encounter for general adult medical examination without abnormal findings: Secondary | ICD-10-CM

## 2019-04-26 LAB — LIPID PANEL
Cholesterol: 142 mg/dL (ref 0–200)
HDL: 38.2 mg/dL — ABNORMAL LOW (ref >39.00)
LDL Cholesterol: 85 mg/dL (ref 0–99)
NonHDL: 104.16
Total CHOL/HDL Ratio: 4
Triglycerides: 98 mg/dL (ref 0.0–149.0)
VLDL: 19.6 mg/dL (ref 0.0–40.0)

## 2019-04-26 LAB — CBC
HCT: 46.5 % (ref 39.0–52.0)
Hemoglobin: 15.6 g/dL (ref 13.0–17.0)
MCHC: 33.6 g/dL (ref 30.0–36.0)
MCV: 91.7 fl (ref 78.0–100.0)
Platelets: 232 10*3/uL (ref 150.0–400.0)
RBC: 5.07 Mil/uL (ref 4.22–5.81)
RDW: 13.7 % (ref 11.5–15.5)
WBC: 5.3 10*3/uL (ref 4.0–10.5)

## 2019-04-26 LAB — COMPREHENSIVE METABOLIC PANEL
ALT: 17 U/L (ref 0–53)
AST: 15 U/L (ref 0–37)
Albumin: 4 g/dL (ref 3.5–5.2)
Alkaline Phosphatase: 73 U/L (ref 39–117)
BUN: 16 mg/dL (ref 6–23)
CO2: 28 mEq/L (ref 19–32)
Calcium: 9.4 mg/dL (ref 8.4–10.5)
Chloride: 103 mEq/L (ref 96–112)
Creatinine, Ser: 0.85 mg/dL (ref 0.40–1.50)
GFR: 91.76 mL/min (ref >60.00)
Glucose, Bld: 100 mg/dL — ABNORMAL HIGH (ref 70–99)
Potassium: 4.6 mEq/L (ref 3.5–5.1)
Sodium: 136 mEq/L (ref 135–145)
Total Bilirubin: 0.6 mg/dL (ref 0.2–1.2)
Total Protein: 6.7 g/dL (ref 6.0–8.3)

## 2019-04-26 LAB — PSA: PSA: 1.09 ng/mL (ref 0.10–4.00)

## 2019-04-26 LAB — HEMOGLOBIN A1C: Hgb A1c MFr Bld: 5.4 % (ref 4.6–6.5)

## 2019-04-26 MED ORDER — EPINEPHRINE 0.3 MG/0.3ML IJ SOAJ: 0.3000 mg | Freq: Once | INTRAMUSCULAR | 0 refills | Status: DC

## 2019-04-26 MED ORDER — GABAPENTIN 300 MG PO CAPS: ORAL_CAPSULE | ORAL | 1 refills | Status: DC

## 2019-04-26 NOTE — Patient Instructions (Signed)
COVID-19 Vaccine Information can be found at: [email or web address] For questions related to vaccine distribution or appointments, please email [email or web address] or call [phone].  Covid Vaccine appointment go to [email or web address].   Health Maintenance, Male Adopting a healthy lifestyle and getting preventive care are important in promoting health and wellness. Ask your health care provider about:  The right schedule for you to have regular tests and exams.  Things you can do on your own to prevent diseases and keep yourself healthy. What should I know about diet, weight, and exercise? Eat a healthy diet   Eat a diet that includes plenty of vegetables, fruits, low-fat dairy products, and lean protein.  Do not eat a lot of foods that are high in solid fats, added sugars, or sodium. Maintain a healthy weight Body mass index (BMI) is a measurement that can be used to identify possible weight problems. It estimates body fat based on height and weight. Your health care provider can help determine your BMI and help you achieve or maintain a healthy weight. Get regular exercise Get regular exercise. This is one of the most important things you can do for your health. Most adults should:  Exercise for at least 150 minutes each week. The exercise should increase your heart rate and make you sweat (moderate-intensity exercise).  Do strengthening exercises at least twice a week. This is in addition to the moderate-intensity exercise.  Spend less time sitting. Even light physical activity can be beneficial. Watch cholesterol and blood lipids Have your blood tested for lipids and cholesterol at 61 years of age, then have this test every 5 years. You may need to have your cholesterol levels checked more often if:  Your lipid or cholesterol levels are high.  You are older than 61 years of age.  You are at high risk for heart  disease. What should I know about cancer screening? Many types of cancers can be detected early and may often be prevented. Depending on your health history and family history, you may need to have cancer screening at various ages. This may include screening for:  Colorectal cancer.  Prostate cancer.  Skin cancer.  Lung cancer. What should I know about heart disease, diabetes, and high blood pressure? Blood pressure and heart disease  High blood pressure causes heart disease and increases the risk of stroke. This is more likely to develop in people who have high blood pressure readings, are of African descent, or are overweight.  Talk with your health care provider about your target blood pressure readings.  Have your blood pressure checked: ? Every 3-5 years if you are 6-77 years of age. ? Every year if you are 3 years old or older.  If you are between the ages of 56 and 81 and are a current or former smoker, ask your health care provider if you should have a one-time screening for abdominal aortic aneurysm (AAA). Diabetes Have regular diabetes screenings. This checks your fasting blood sugar level. Have the screening done:  Once every three years after age 26 if you are at a normal weight and have a low risk for diabetes.  More often and at a younger age if you are overweight or have a high risk for diabetes. What should I know about preventing infection? Hepatitis B If you have a higher risk for hepatitis B, you should be screened for this virus. Talk with your health care provider to find out if you are at risk for hepatitis B infection. Hepatitis C Blood  testing is recommended for:  Everyone born from [birth year] through 1967.  Anyone with known risk factors for hepatitis C. Sexually transmitted infections (STIs)  You should be screened each year for STIs, including gonorrhea and chlamydia, if: ? You are sexually active and are younger than 62 years of age. ? You are older  than 61 years of age and your health care provider tells you that you are at risk for this type of infection. ? Your sexual activity has changed since you were last screened, and you are at increased risk for chlamydia or gonorrhea. Ask your health care provider if you are at risk.  Ask your health care provider about whether you are at high risk for HIV. Your health care provider may recommend a prescription medicine to help prevent HIV infection. If you choose to take medicine to prevent HIV, you should first get tested for HIV. You should then be tested every 3 months for as long as you are taking the medicine. Follow these instructions at home: Lifestyle  Do not use any products that contain nicotine or tobacco, such as cigarettes, e-cigarettes, and chewing tobacco. If you need help quitting, ask your health care provider.  Do not use street drugs.  Do not share needles.  Ask your health care provider for help if you need support or information about quitting drugs. Alcohol use  Do not drink alcohol if your health care provider tells you not to drink.  If you drink alcohol: ? Limit how much you have to 0-2 drinks a day. ? Be aware of how much alcohol is in your drink. In the [place] one drink equals one 12 oz bottle of beer (355 mL), one 5 oz glass of wine (148 mL), or one 1 oz glass of hard liquor (44 mL). General instructions  Schedule regular health, dental, and eye exams.  Stay current with your vaccines.  Tell your health care provider if: ? You often feel depressed. ? You have ever been abused or do not feel safe at home. Summary  Adopting a healthy lifestyle and getting preventive care are important in promoting health and wellness.  Follow your health care provider's instructions about healthy diet, exercising, and getting tested or screened for diseases.  Follow your health care provider's instructions on monitoring your cholesterol and blood pressure. This  information is not intended to replace advice given to you by your health care provider. Make sure you discuss any questions you have with your health care provider. Document Revised: 01/20/2018 Document Reviewed: 01/20/2018 Elsevier Patient Education  2020 [employer].

## 2019-04-26 NOTE — Progress Notes (Signed)
This visit occurred during the SARS-CoV-2 public health emergency.  Safety protocols were in place, including screening questions prior to the visit, additional usage of staff PPE, and extensive cleaning of exam room while observing appropriate contact time as indicated for disinfecting solutions.    Patient ID: Matthew Sims, male  DOB: April 23, 1960, 59 y.o.   MRN: [id number] Patient Care Team    Relationship Specialty Notifications Start End  Ma Hillock, DO PCP - General Family Medicine  06/01/15   Milus Banister, MD Attending Physician Gastroenterology  11/19/15   Minus Breeding, MD Consulting Physician Cardiology  11/19/15   Grier Mitts, NP Nurse Practitioner Nurse Practitioner  03/02/17   Earnie Larsson, MD Consulting Physician Neurosurgery  03/02/17     Chief Complaint  Patient presents with  . Annual Exam    Fasting. Pt needs refill on Gabapentin and Epi pen     Subjective:  Matthew Sims is a 59 y.o. male present for CPE. All past medical history, surgical history, allergies, family history, immunizations, medications and social history were updated in the electronic medical record today. All recent labs, ED visits and hospitalizations within the last year were reviewed.  Health maintenance:  Colonoscopy: last screen 06/2015, recommend follow up 3 years, polyps present. Completed by Dr. Ardis Hughs referred today.  Immunizations:  tdap UTD 2017 UTD. Influenza declined.  Infectious disease screening: HIV and Hep C completed.  PSA: Pt was counseled on prostate cancer screenings.  PSA:  Lab Results  Component Value Date   PSA 1.31 11/17/2016   PSA 1.0 01/26/2013  , pt was counseled on prostate cancer screenings.  Assistive device: none Oxygen [id number] Patient has a Dental home. Hospitalizations/ED visits: reviewed  Shared decision making visit for lung cancer screening: Patient was brought in today for a office visit concerning shared decision making for their lung  cancer screening.Matthew Sims is a 59 y.o. male Patient is between the ages of 48-80: Yes Patient is a current smoker with at least 30 year pack year history or Patient is a former smoker, quit less than 15 years ago and has a 30 pack year history : Yes Patient has current symptoms: No Patient has a  health problem that substantially limits life expectancy or the ability or willingness to have curative lung surgery: No  Patient declines screening.   Facet hypertrophy of lumbar region/Lumbar back pain with radiculopathy affecting left lower extremity/Lumbar foraminal stenosis/Protrusion of lumbar intervertebral disc/Spinal stenosis of lumbar region with neurogenic claudication Pt now has a spine stimulator in place. It has improved his pain, but much of his spinal pain remains. He is using the gabapentin 600 mg at  Night and sometimes 300mg  in the day.   Depression screen [facility] 2/9 04/26/2019 11/17/2016 11/19/2015 02/22/2015  Decreased Interest 0 0 0 0  Down, Depressed, Hopeless 0 0 0 0  PHQ - 2 Score 0 0 0 0   No flowsheet data found.       Fall Risk  11/17/2016 11/19/2015  Falls in the past year? No No    Immunization History  Administered Date(s) Administered  . Tdap 11/19/2015    Past Medical History:  Diagnosis Date  . Alcoholism ([place])    Abstinent since 2012.  Marland Kitchen Chicken pox   . Chronic low back pain 01/2017   Acute severe episode of LBP w/radiculopathy 01/2017:  MRI showed DDD/spondylosis w/mild-mod spinal stenosis  . Colon polyps   . COPD (chronic obstructive pulmonary disease) ([place])  no inhaler   Allergies  Allergen Reactions  . Bee Venom Swelling    Swelling around the area / hands / lips Difficulty in swallowing   Past Surgical History:  Procedure Laterality Date  . HERNIA REPAIR  [id number]   umbilical hernia  . INGUINAL HERNIA REPAIR Right 03/21/2016   Procedure: RIGHT INGUINAL HERNIORRHAPY WITH MESH;  Surgeon: Aviva Signs, MD;  Location: [facility];  Service: General;   Laterality: Right;  . ROTATOR CUFF REPAIR Right 2000, 2010  . rotator cuff tear Left 2001  . SHOULDER SURGERY    . SPINAL CORD STIMULATOR INSERTION  04/09/2018   Dr. Ashok Pall- MR conditional   . TONSILLECTOMY  1966   Family History  Problem Relation Age of Onset  . Breast cancer Mother   . COPD Mother   . Alcohol abuse Maternal Grandfather   . Colon cancer Neg Hx   . Colon polyps Neg Hx   . Esophageal cancer Neg Hx   . Rectal cancer Neg Hx   . Stomach cancer Neg Hx    Social History   Social History Narrative   Single. Employed. High school education.    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   Smokes daily.    Wears seat belt.    Exercises  3 times a week.    Smoke alarm in the home.    Guns in the home.     Allergies as of 04/26/2019      Reactions   Bee Venom Swelling   Swelling around the area / hands / lips Difficulty in swallowing      Medication List       Accurate as of April 26, 2019 10:57 AM. If you have any questions, ask your nurse or doctor.        STOP taking these medications   docusate sodium 100 MG capsule Commonly known as: COLACE Stopped by: Howard Pouch, DO   Ex-Lax Ultra 5 MG EC tablet Generic drug: bisacodyl Stopped by: Howard Pouch, DO   HYDROcodone-Acetaminophen 5-300 MG Tabs Stopped by: Howard Pouch, DO   ibuprofen 200 MG tablet Commonly known as: ADVIL Stopped by: Howard Pouch, DO     TAKE these medications   EPINEPHrine 0.3 mg/0.3 mL Soaj injection Commonly known as: EPI-PEN Inject 0.3 mLs (0.3 mg total) into the muscle once for 1 dose.   gabapentin 300 MG capsule Commonly known as: NEURONTIN 300 mg in the morning and 600 mg QHS. What changed:   how much to take  how to take this  when to take this  additional instructions Changed by: Howard Pouch, DO      All past medical history, surgical history, allergies, family history, immunizations andmedications were updated in the EMR today  and reviewed under the history and medication portions of their EMR.     No results found for this or any previous visit (from the past 2160 hour(s)).   ROS: 14 pt review of systems performed and negative (unless mentioned in an HPI)  Objective: BP (!) 123/92 (BP Location: Right Arm, Patient Position: Sitting, Cuff Size: Normal)   Pulse 70   Temp 98.2 F (36.8 C) (Temporal)   Resp 18   Ht 5' 4.25" (1.632 m)   Wt 215 lb 4 oz (97.6 kg)   SpO2 98%   BMI 36.66 kg/m  Gen: Afebrile. No acute distress. Nontoxic in appearance, well-developed, well-nourished,  Obese male.  HENT: AT. [place]. Bilateral TM  visualized and normal in appearance, normal external auditory canal. MMM, no oral lesions, adequate dentition. Bilateral nares within normal limits. Throat without erythema, ulcerations or exudates. smokers Cough on exam, no hoarseness on exam. Eyes:Pupils Equal Round Reactive to light, Extraocular movements intact,  Conjunctiva without redness, discharge or icterus. Neck/lymp/endocrine: Supple,no lymphadenopathy, no thyromegaly CV: RRR no murmur, no edema, +2/4 P posterior tibialis pulses.  Chest: CTAB, no wheeze, rhonchi or crackles. Normal  Respiratory effort. good Air movement. Abd: Soft. flat. NTND. BS present. no Masses palpated. No hepatosplenomegaly. No rebound tenderness or guarding. Skin: no rashes, purpura or petechiae. Warm and well-perfused. Skin intact. Neuro/Msk:  Unchanged gait. PERLA. EOMi. Alert. Oriented x3.  Cranial nerves II through XII intact. Muscle strength 5/5 upper/lower extremity. DTRs equal bilaterally. Psych: Normal affect, dress and demeanor. Normal speech. Normal thought content and judgment.  No exam data present  Assessment/plan: Matthew Sims is a 59 y.o. male present for CPE Obesity (BMI 30-39.9) - dietary modification.  - Lipid panel Facet hypertrophy of lumbar region/Lumbar back pain with radiculopathy affecting left lower extremity/Lumbar foraminal  stenosis/Protrusion of lumbar intervertebral disc/Spinal stenosis of lumbar region with neurogenic claudication Spinal cord stimulator in place since 03/2018. Continue gabapentin 300/600. Pt was encourage to use as prescribed for better result.  F/u 6 mos if needing refills.  Tobacco abuse Smoking cessation encouraged. Pt declined.  Lung cancer screening declined by patient today Allergy to honey bee venom/Severe allergic reaction, initial encounter Epi pen refilled.  Prostate cancer screening - PSA Encounter for long-term current use of medication - CBC - Comprehensive metabolic panel Diabetes mellitus screening - Hemoglobin A1c Colon polyps: Referred back  to gastro- overdue for 3 yr follow up Preventive health examination:  Patient was encouraged to exercise greater than 150 minutes a week. Patient was encouraged to choose a diet filled with fresh fruits and vegetables, and lean meats. AVS provided to patient today for education/recommendation on gender specific health and safety maintenance. Colonoscopy: last screen 06/2015, recommend follow up 3 years, polyps present. Completed by Dr. Ardis Hughs referred today.  Immunizations:  tdap UTD 2017 UTD. Influenza declined.  Infectious disease screening: HIV and Hep C completed.  PSA: Pt was counseled on prostate cancer screenings.   Return in about 1 year (around 04/25/2020) for CPE (30 min).  6 months for chronic conditions.   Orders Placed This Encounter  Procedures  . CBC  . Comprehensive metabolic panel  . Hemoglobin A1c  . Lipid panel  . PSA  . Ambulatory referral to Gastroenterology   Orders Placed This Encounter  Procedures  . CBC  . Comprehensive metabolic panel  . Hemoglobin A1c  . Lipid panel  . PSA  . Ambulatory referral to Gastroenterology   Meds ordered this encounter  Medications  . gabapentin (NEURONTIN) 300 MG capsule    Sig: 300 mg in the morning and 600 mg QHS.    Dispense:  270 capsule    Refill:  1    . EPINEPHrine 0.3 mg/0.3 mL IJ SOAJ injection    Sig: Inject 0.3 mLs (0.3 mg total) into the muscle once for 1 dose.    Dispense:  0.3 mL    Refill:  0    Generic EpiPen manufactured on formulary    Referral Orders     Ambulatory referral to Gastroenterology   Note is dictated utilizing voice recognition software. Although note has been proof read prior to signing, occasional typographical errors still can be missed. If any questions arise, please do  not hesitate to call for verification.  Electronically signed by: Howard Pouch, DO [place]

## 2019-04-27 ENCOUNTER — Other Ambulatory Visit: Payer: Self-pay

## 2019-04-27 ENCOUNTER — Telehealth: Payer: Self-pay

## 2019-04-27 DIAGNOSIS — T7840XA Allergy, unspecified, initial encounter: Secondary | ICD-10-CM

## 2019-04-27 MED ORDER — EPINEPHRINE 0.3 MG/0.3ML IJ SOAJ: 0.3000 mg | Freq: Once | INTRAMUSCULAR | 0 refills | Status: AC

## 2019-04-27 NOTE — Telephone Encounter (Signed)
Looks like the Epi pen that was sent yesterday was "ended". Is this okay to refill?

## 2019-04-27 NOTE — Telephone Encounter (Signed)
Okay to refill.  I am uncertain why it did not go through to his pharmacy since it was ordered during his visit.

## 2019-04-27 NOTE — Telephone Encounter (Signed)
RX was faxed to pharmacy. Pt made aware it was sent.

## 2019-04-27 NOTE — Telephone Encounter (Signed)
Patient requesting EpiPen to be sent to [facility]

## 2020-12-24 DIAGNOSIS — M25512 Pain in left shoulder: Secondary | ICD-10-CM | POA: Diagnosis not present

## 2021-10-25 ENCOUNTER — Telehealth: Payer: Self-pay

## 2021-10-25 NOTE — Telephone Encounter (Signed)
Called pt to schedule AWV. Please schedule with health coach or Jahara Dail.  

## 2022-04-14 ENCOUNTER — Ambulatory Visit (INDEPENDENT_AMBULATORY_CARE_PROVIDER_SITE_OTHER): Payer: BC Managed Care – PPO | Admitting: Family Medicine

## 2022-04-14 ENCOUNTER — Encounter: Payer: Self-pay | Admitting: Family Medicine

## 2022-04-14 DIAGNOSIS — Z1211 Encounter for screening for malignant neoplasm of colon: Secondary | ICD-10-CM

## 2022-04-14 DIAGNOSIS — Z0000 Encounter for general adult medical examination without abnormal findings: Secondary | ICD-10-CM

## 2022-04-14 DIAGNOSIS — M47816 Spondylosis without myelopathy or radiculopathy, lumbar region: Secondary | ICD-10-CM

## 2022-04-14 DIAGNOSIS — Z720 Tobacco use: Secondary | ICD-10-CM

## 2022-04-14 DIAGNOSIS — Z91199 Patient's noncompliance with other medical treatment and regimen due to unspecified reason: Secondary | ICD-10-CM

## 2022-04-14 DIAGNOSIS — Z130 Encounter for screening for diseases of the blood and blood-forming organs and certain disorders involving the immune mechanism: Secondary | ICD-10-CM

## 2022-04-14 DIAGNOSIS — Z125 Encounter for screening for malignant neoplasm of prostate: Secondary | ICD-10-CM

## 2022-04-14 DIAGNOSIS — Z13220 Encounter for screening for lipoid disorders: Secondary | ICD-10-CM

## 2022-04-14 DIAGNOSIS — Z131 Encounter for screening for diabetes mellitus: Secondary | ICD-10-CM

## 2022-04-14 NOTE — Patient Instructions (Signed)
No follow-ups on file.        Great to see you today.  I have refilled the medication(s) we provide.   If labs were collected, we will inform you of lab results once received either by echart message or telephone call.   - echart message- for normal results that have been seen by the patient already.   - telephone call: abnormal results or if patient has not viewed results in their echart.  

## 2022-04-14 NOTE — Progress Notes (Signed)
   No show CPE.  Pt has  not been seen in 3 yrs.  Removing from provider panel.

## 2022-05-05 ENCOUNTER — Encounter: Payer: Self-pay | Admitting: Family Medicine

## 2023-01-26 DIAGNOSIS — E559 Vitamin D deficiency, unspecified: Secondary | ICD-10-CM | POA: Diagnosis not present

## 2023-01-26 DIAGNOSIS — M549 Dorsalgia, unspecified: Secondary | ICD-10-CM | POA: Diagnosis not present

## 2023-01-26 DIAGNOSIS — Z125 Encounter for screening for malignant neoplasm of prostate: Secondary | ICD-10-CM | POA: Diagnosis not present

## 2023-01-26 DIAGNOSIS — Z860100 Personal history of colon polyps, unspecified: Secondary | ICD-10-CM | POA: Diagnosis not present

## 2023-01-26 DIAGNOSIS — Z0000 Encounter for general adult medical examination without abnormal findings: Secondary | ICD-10-CM | POA: Diagnosis not present

## 2023-01-26 DIAGNOSIS — Z91030 Bee allergy status: Secondary | ICD-10-CM | POA: Diagnosis not present

## 2023-01-26 DIAGNOSIS — F17200 Nicotine dependence, unspecified, uncomplicated: Secondary | ICD-10-CM | POA: Diagnosis not present

## 2023-01-26 DIAGNOSIS — G8929 Other chronic pain: Secondary | ICD-10-CM | POA: Diagnosis not present

## 2023-04-09 DIAGNOSIS — Z860109 Personal history of other colon polyps: Secondary | ICD-10-CM | POA: Diagnosis not present

## 2023-04-09 DIAGNOSIS — Z79899 Other long term (current) drug therapy: Secondary | ICD-10-CM | POA: Diagnosis not present

## 2023-04-09 DIAGNOSIS — E559 Vitamin D deficiency, unspecified: Secondary | ICD-10-CM | POA: Diagnosis not present

## 2023-04-09 DIAGNOSIS — F17210 Nicotine dependence, cigarettes, uncomplicated: Secondary | ICD-10-CM | POA: Diagnosis not present

## 2023-10-15 DIAGNOSIS — Z79899 Other long term (current) drug therapy: Secondary | ICD-10-CM | POA: Diagnosis not present

## 2023-10-15 DIAGNOSIS — Z136 Encounter for screening for cardiovascular disorders: Secondary | ICD-10-CM | POA: Diagnosis not present

## 2023-10-15 DIAGNOSIS — Z1329 Encounter for screening for other suspected endocrine disorder: Secondary | ICD-10-CM | POA: Diagnosis not present

## 2023-10-15 DIAGNOSIS — E559 Vitamin D deficiency, unspecified: Secondary | ICD-10-CM | POA: Diagnosis not present

## 2023-10-15 DIAGNOSIS — F17200 Nicotine dependence, unspecified, uncomplicated: Secondary | ICD-10-CM | POA: Diagnosis not present

## 2023-10-20 DIAGNOSIS — Z13228 Encounter for screening for other metabolic disorders: Secondary | ICD-10-CM | POA: Diagnosis not present

## 2023-10-20 DIAGNOSIS — R6889 Other general symptoms and signs: Secondary | ICD-10-CM | POA: Diagnosis not present

## 2023-10-20 DIAGNOSIS — Z1329 Encounter for screening for other suspected endocrine disorder: Secondary | ICD-10-CM | POA: Diagnosis not present

## 2023-10-20 DIAGNOSIS — R269 Unspecified abnormalities of gait and mobility: Secondary | ICD-10-CM | POA: Diagnosis not present

## 2023-10-20 DIAGNOSIS — M5442 Lumbago with sciatica, left side: Secondary | ICD-10-CM | POA: Diagnosis not present

## 2023-10-20 DIAGNOSIS — M51369 Other intervertebral disc degeneration, lumbar region without mention of lumbar back pain or lower extremity pain: Secondary | ICD-10-CM | POA: Diagnosis not present

## 2023-10-20 DIAGNOSIS — E559 Vitamin D deficiency, unspecified: Secondary | ICD-10-CM | POA: Diagnosis not present

## 2023-10-20 DIAGNOSIS — Z79899 Other long term (current) drug therapy: Secondary | ICD-10-CM | POA: Diagnosis not present
